# Patient Record
Sex: Female | Born: 1975 | Race: White | Hispanic: No | Smoking: Current every day smoker
Health system: Southern US, Community
[De-identification: ages and names within clinical notes are randomized; demographics above are authoritative.]

## PROBLEM LIST (undated history)

## (undated) DIAGNOSIS — F329 Major depressive disorder, single episode, unspecified: Secondary | ICD-10-CM

## (undated) DIAGNOSIS — J449 Chronic obstructive pulmonary disease, unspecified: Secondary | ICD-10-CM

## (undated) DIAGNOSIS — F419 Anxiety disorder, unspecified: Secondary | ICD-10-CM

## (undated) DIAGNOSIS — Z9884 Bariatric surgery status: Secondary | ICD-10-CM

## (undated) DIAGNOSIS — E079 Disorder of thyroid, unspecified: Secondary | ICD-10-CM

## (undated) DIAGNOSIS — G4700 Insomnia, unspecified: Secondary | ICD-10-CM

## (undated) DIAGNOSIS — E039 Hypothyroidism, unspecified: Secondary | ICD-10-CM

## (undated) DIAGNOSIS — IMO0001 Reserved for inherently not codable concepts without codable children: Secondary | ICD-10-CM

## (undated) DIAGNOSIS — F988 Other specified behavioral and emotional disorders with onset usually occurring in childhood and adolescence: Secondary | ICD-10-CM

## (undated) DIAGNOSIS — J45909 Unspecified asthma, uncomplicated: Secondary | ICD-10-CM

## (undated) DIAGNOSIS — G4730 Sleep apnea, unspecified: Secondary | ICD-10-CM

## (undated) DIAGNOSIS — F32A Depression, unspecified: Secondary | ICD-10-CM

## (undated) DIAGNOSIS — F319 Bipolar disorder, unspecified: Secondary | ICD-10-CM

## (undated) DIAGNOSIS — D500 Iron deficiency anemia secondary to blood loss (chronic): Secondary | ICD-10-CM

## (undated) DIAGNOSIS — R7303 Prediabetes: Secondary | ICD-10-CM

## (undated) DIAGNOSIS — K219 Gastro-esophageal reflux disease without esophagitis: Secondary | ICD-10-CM

## (undated) HISTORY — DX: Other specified behavioral and emotional disorders with onset usually occurring in childhood and adolescence: F98.8

## (undated) HISTORY — PX: ECTOPIC PREGNANCY SURGERY: SHX613

## (undated) HISTORY — DX: Anxiety disorder, unspecified: F41.9

## (undated) HISTORY — PX: GALLBLADDER SURGERY: SHX652

## (undated) HISTORY — DX: Disorder of thyroid, unspecified: E07.9

## (undated) HISTORY — DX: Bariatric surgery status: Z98.84

## (undated) HISTORY — DX: Gastro-esophageal reflux disease without esophagitis: K21.9

## (undated) HISTORY — PX: TONSILLECTOMY: SUR1361

## (undated) HISTORY — PX: CHOLECYSTECTOMY: SHX55

## (undated) HISTORY — DX: Reserved for inherently not codable concepts without codable children: IMO0001

## (undated) HISTORY — PX: DILATION AND CURETTAGE OF UTERUS: SHX78

## (undated) HISTORY — DX: Depression, unspecified: F32.A

## (undated) HISTORY — DX: Major depressive disorder, single episode, unspecified: F32.9

## (undated) HISTORY — DX: Insomnia, unspecified: G47.00

## (undated) HISTORY — DX: Iron deficiency anemia secondary to blood loss (chronic): D50.0

## (undated) HISTORY — PX: OTHER SURGICAL HISTORY: SHX169

---

## 2004-10-23 ENCOUNTER — Emergency Department: Payer: Self-pay | Admitting: Emergency Medicine

## 2005-11-17 ENCOUNTER — Emergency Department: Payer: Self-pay | Admitting: Emergency Medicine

## 2005-11-18 ENCOUNTER — Observation Stay: Payer: Self-pay | Admitting: Vascular Surgery

## 2006-02-25 ENCOUNTER — Other Ambulatory Visit: Payer: Self-pay

## 2006-02-25 ENCOUNTER — Inpatient Hospital Stay: Payer: Self-pay | Admitting: Internal Medicine

## 2006-02-26 ENCOUNTER — Other Ambulatory Visit: Payer: Self-pay

## 2006-03-21 ENCOUNTER — Ambulatory Visit: Payer: Self-pay | Admitting: Otolaryngology

## 2006-04-23 ENCOUNTER — Inpatient Hospital Stay: Payer: Self-pay | Admitting: Psychiatry

## 2006-08-06 ENCOUNTER — Encounter: Payer: Self-pay | Admitting: Maternal & Fetal Medicine

## 2006-08-13 ENCOUNTER — Encounter: Payer: Self-pay | Admitting: Maternal & Fetal Medicine

## 2006-09-24 ENCOUNTER — Encounter: Payer: Self-pay | Admitting: Maternal & Fetal Medicine

## 2007-02-11 ENCOUNTER — Ambulatory Visit: Payer: Self-pay | Admitting: Obstetrics & Gynecology

## 2007-02-12 ENCOUNTER — Inpatient Hospital Stay: Payer: Self-pay | Admitting: Obstetrics & Gynecology

## 2007-06-04 ENCOUNTER — Encounter: Payer: Self-pay | Admitting: Anesthesiology

## 2007-06-10 ENCOUNTER — Encounter: Payer: Self-pay | Admitting: Anesthesiology

## 2009-02-03 ENCOUNTER — Emergency Department: Payer: Self-pay | Admitting: Emergency Medicine

## 2009-02-21 ENCOUNTER — Inpatient Hospital Stay: Payer: Self-pay

## 2009-02-26 ENCOUNTER — Emergency Department: Payer: Self-pay | Admitting: Emergency Medicine

## 2009-09-16 ENCOUNTER — Emergency Department: Payer: Self-pay | Admitting: Emergency Medicine

## 2010-03-02 ENCOUNTER — Emergency Department: Payer: Self-pay | Admitting: Emergency Medicine

## 2010-03-17 ENCOUNTER — Encounter: Payer: Self-pay | Admitting: Maternal & Fetal Medicine

## 2010-03-29 ENCOUNTER — Emergency Department: Payer: Self-pay | Admitting: Emergency Medicine

## 2010-04-05 ENCOUNTER — Ambulatory Visit: Payer: Self-pay | Admitting: Family Medicine

## 2010-04-15 ENCOUNTER — Emergency Department: Payer: Self-pay | Admitting: Emergency Medicine

## 2010-04-19 ENCOUNTER — Ambulatory Visit: Payer: Self-pay | Admitting: Obstetrics & Gynecology

## 2010-04-21 ENCOUNTER — Emergency Department: Payer: Self-pay | Admitting: Emergency Medicine

## 2010-06-27 ENCOUNTER — Emergency Department: Payer: Self-pay | Admitting: Unknown Physician Specialty

## 2011-04-13 ENCOUNTER — Emergency Department: Payer: Self-pay | Admitting: *Deleted

## 2012-03-27 ENCOUNTER — Emergency Department: Payer: Self-pay | Admitting: Emergency Medicine

## 2012-06-11 ENCOUNTER — Ambulatory Visit: Payer: Self-pay | Admitting: Family Medicine

## 2012-08-22 ENCOUNTER — Emergency Department: Payer: Self-pay | Admitting: Emergency Medicine

## 2013-04-01 ENCOUNTER — Encounter: Payer: Self-pay | Admitting: Family Medicine

## 2013-04-09 ENCOUNTER — Encounter: Payer: Self-pay | Admitting: Family Medicine

## 2013-05-10 ENCOUNTER — Encounter: Payer: Self-pay | Admitting: Family Medicine

## 2013-05-13 ENCOUNTER — Ambulatory Visit: Payer: Self-pay | Admitting: Gastroenterology

## 2013-05-15 LAB — PATHOLOGY REPORT

## 2013-06-09 ENCOUNTER — Encounter: Payer: Self-pay | Admitting: Family Medicine

## 2013-07-10 ENCOUNTER — Encounter: Payer: Self-pay | Admitting: Family Medicine

## 2013-08-10 ENCOUNTER — Encounter: Payer: Self-pay | Admitting: Family Medicine

## 2013-09-07 ENCOUNTER — Encounter: Payer: Self-pay | Admitting: Family Medicine

## 2013-09-29 ENCOUNTER — Encounter: Payer: Self-pay | Admitting: Podiatry

## 2013-09-29 ENCOUNTER — Ambulatory Visit (INDEPENDENT_AMBULATORY_CARE_PROVIDER_SITE_OTHER): Payer: Medicaid Other

## 2013-09-29 ENCOUNTER — Ambulatory Visit (INDEPENDENT_AMBULATORY_CARE_PROVIDER_SITE_OTHER): Payer: Medicaid Other | Admitting: Podiatry

## 2013-09-29 VITALS — BP 140/90 | HR 86 | Resp 16 | Ht 62.0 in | Wt 315.0 lb

## 2013-09-29 DIAGNOSIS — M778 Other enthesopathies, not elsewhere classified: Secondary | ICD-10-CM

## 2013-09-29 DIAGNOSIS — M779 Enthesopathy, unspecified: Secondary | ICD-10-CM

## 2013-09-29 DIAGNOSIS — M79671 Pain in right foot: Secondary | ICD-10-CM

## 2013-09-29 DIAGNOSIS — M7752 Other enthesopathy of left foot: Secondary | ICD-10-CM

## 2013-09-29 DIAGNOSIS — M21619 Bunion of unspecified foot: Secondary | ICD-10-CM

## 2013-09-29 DIAGNOSIS — M65979 Unspecified synovitis and tenosynovitis, unspecified ankle and foot: Secondary | ICD-10-CM

## 2013-09-29 DIAGNOSIS — M659 Synovitis and tenosynovitis, unspecified: Secondary | ICD-10-CM

## 2013-09-29 NOTE — Progress Notes (Signed)
   Subjective:    Patient ID: Melanie Nicholson, female    DOB: 31-May-1976, 38 y.o.   MRN: [id number]  HPI Comments: Larey SeatFell back in feb of 2014 and since has had discomfort and pain with the left ankle. It stays swollen and getting worse with the pain. Had a mri done, that showed just tendonitis. i have used numerous braces, saw a orthapedic for it. Have stayed off of it, ice. Tried mobic. Marland Kitchen.   Foot Pain Associated symptoms include headaches.      Review of Systems  Musculoskeletal:       Joint pain  Back pain difficulty walking   Neurological: Positive for headaches.  All other systems reviewed and are negative.       Objective:   Physical Exam I have reviewed her past medical history medications allergies surgeries social history and review of systems. Vital signs are stable she is alert and oriented x3. Pulses are strongly palpable bilateral. Neurologic sensorium is intact per since once the monofilament bilateral. Deep tendon reflexes are brisk and equal bilateral. Neurologic sensorium is intact per since once the monofilament. Muscle strength is 5 over 5 dorsiflexors plantar flexors inverters everters all intrinsic musculature is intact. Orthopedic evaluation demonstrates all joints distal to the ankle a full range of motion without crepitus she has limitation on range of motion of the subtalar joint of the left foot either secondary to arthritic changes or secondary to splinting. Radiographic evaluation concerns me for a subtalar joint arthritic the posterior facet. She also has pain to the anterior talofibular ligament of the left ankle.        Assessment & Plan:  Assessment: Subtalar joint osteoarthritis posterior facet left.  Plan: Discussed etiology pathology conservative versus surgical therapies at this point I injected her subtalar joint of her left foot with Kenalog and local anesthetic after sterile Betadine skin prep due to her diabetes I did not provided her with a Medrol  Dosepak.

## 2013-09-30 ENCOUNTER — Telehealth: Payer: Self-pay | Admitting: *Deleted

## 2013-09-30 NOTE — Telephone Encounter (Signed)
Pt called said she was in yesterday and seen dr Al Corpushyatt and received injection in her ankle and now she feels bad and her ankle feels worse and it hurts when walking. Pt informed me she had a lot of braces for her foot but she could not wear a walking boot cause she can not walk in one. Told pt to ice,elevate, stay off of foot and to put on one of her braces on. Also told pt that it could take a couple of days before injection helps and it could make pain a little worse. Told pt to call back it symptoms worsen. Pt understood.

## 2013-10-27 ENCOUNTER — Ambulatory Visit: Payer: Medicaid Other | Admitting: Podiatry

## 2013-11-05 ENCOUNTER — Ambulatory Visit (INDEPENDENT_AMBULATORY_CARE_PROVIDER_SITE_OTHER): Payer: Medicaid Other | Admitting: Podiatry

## 2013-11-05 VITALS — Resp 16 | Ht 62.0 in | Wt 310.0 lb

## 2013-11-05 DIAGNOSIS — M779 Enthesopathy, unspecified: Principal | ICD-10-CM

## 2013-11-05 DIAGNOSIS — M778 Other enthesopathies, not elsewhere classified: Secondary | ICD-10-CM

## 2013-11-05 DIAGNOSIS — M7660 Achilles tendinitis, unspecified leg: Secondary | ICD-10-CM

## 2013-11-05 NOTE — Progress Notes (Signed)
She presents today for followup of subtalar joint capsulitis of the left foot and ankle.  Objective: She is much decrease in pain since last visit that she still has some tenderness on in range of motion of the subtalar joint left foot.  Assessment: Subtalar joint capsulitis left.  Plan: Injected left subtalar joint again with Kenalog and local anesthetic.

## 2013-11-26 ENCOUNTER — Ambulatory Visit (INDEPENDENT_AMBULATORY_CARE_PROVIDER_SITE_OTHER): Payer: Medicaid Other | Admitting: Podiatry

## 2013-11-26 VITALS — BP 133/83 | HR 95 | Resp 16

## 2013-11-26 DIAGNOSIS — M778 Other enthesopathies, not elsewhere classified: Secondary | ICD-10-CM

## 2013-11-26 DIAGNOSIS — M779 Enthesopathy, unspecified: Principal | ICD-10-CM

## 2013-11-26 DIAGNOSIS — G5792 Unspecified mononeuropathy of left lower limb: Secondary | ICD-10-CM

## 2013-11-26 DIAGNOSIS — G5760 Lesion of plantar nerve, unspecified lower limb: Secondary | ICD-10-CM

## 2013-11-26 DIAGNOSIS — B353 Tinea pedis: Secondary | ICD-10-CM

## 2013-11-26 MED ORDER — TERBINAFINE HCL 250 MG PO TABS: 250.0000 mg | ORAL_TABLET | Freq: Every day | ORAL | Status: DC

## 2013-11-26 NOTE — Progress Notes (Signed)
She presents today for followup of her subtalar joint capsulitis and sinus tarsi neuritis left foot. She states his second Kenalog shot did not do anything for her. She still feels better than she did previously.  Objective: Vital signs are stable she is alert and oriented x3. She has pain on palpation of the sinus tarsi left and pain on end range of motion of the subtalar joint left. No erythema edema cellulitis drainage or odor is noted.  Assessment: Subtalar joint capsulitis left foot.  Plan: Injected her first dose of dehydrated alcohol to the sinus tarsi of the left foot. We did discuss the possible need for orthotics and all followup with her in the near future.

## 2013-12-17 ENCOUNTER — Encounter: Payer: Self-pay | Admitting: Podiatry

## 2013-12-17 ENCOUNTER — Ambulatory Visit (INDEPENDENT_AMBULATORY_CARE_PROVIDER_SITE_OTHER): Payer: Medicaid Other | Admitting: Podiatry

## 2013-12-17 VITALS — BP 123/80 | HR 78 | Resp 16

## 2013-12-17 DIAGNOSIS — M778 Other enthesopathies, not elsewhere classified: Secondary | ICD-10-CM

## 2013-12-17 DIAGNOSIS — M2140 Flat foot [pes planus] (acquired), unspecified foot: Secondary | ICD-10-CM

## 2013-12-17 DIAGNOSIS — M779 Enthesopathy, unspecified: Principal | ICD-10-CM

## 2013-12-17 NOTE — Progress Notes (Signed)
She presents today states that the subtalar joint injection a provided her with last time did not work she states that I disappointed that the first injection worked of the second 2 injections did not work at all.  Objective: Vital signs are stable she is alert and oriented x3 pulses are palpable bilateral. She has no reproducible pain on palpation of the subtalar joint today.  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  Plan: I suggested an injection she declined. I then suggested orthotics and she was scanned. After scanning she states she did not wear the orthotics because of the price and she would let us know what she wants to do proceed.

## 2013-12-24 ENCOUNTER — Ambulatory Visit: Payer: Medicaid Other | Admitting: Podiatry

## 2014-10-12 ENCOUNTER — Ambulatory Visit (INDEPENDENT_AMBULATORY_CARE_PROVIDER_SITE_OTHER): Payer: Medicaid Other | Admitting: Podiatry

## 2014-10-12 ENCOUNTER — Encounter: Payer: Self-pay | Admitting: Podiatry

## 2014-10-12 ENCOUNTER — Ambulatory Visit (INDEPENDENT_AMBULATORY_CARE_PROVIDER_SITE_OTHER): Payer: Medicaid Other

## 2014-10-12 VITALS — BP 119/83 | HR 71 | Resp 16 | Ht 62.0 in | Wt 240.0 lb

## 2014-10-12 DIAGNOSIS — M7750 Other enthesopathy of unspecified foot: Secondary | ICD-10-CM

## 2014-10-12 DIAGNOSIS — M66872 Spontaneous rupture of other tendons, left ankle and foot: Secondary | ICD-10-CM

## 2014-10-12 DIAGNOSIS — M779 Enthesopathy, unspecified: Secondary | ICD-10-CM

## 2014-10-12 MED ORDER — METHYLPREDNISOLONE (PAK) 4 MG PO TABS: ORAL_TABLET | ORAL | Status: DC

## 2014-10-12 NOTE — Progress Notes (Signed)
   Subjective:    Patient ID: Melanie Nicholson, female    DOB: Oct 04, 1975, 39 y.o.   MRN: [id number]  HPI Comments: "I have pain in the foot and up my leg"  Patient c/o aching, sharp sensations medial and anterior ankle and shin left for about 3 years. She did fall and twist this foot in the snow initially and hasn't been right since. The area swells. She has had treatment here in the past. Was given injections then. She is trying to exercise due to gastric bypass, but making worse. Wears brace occasionally-no help.     Review of Systems  Constitutional: Positive for unexpected weight change.  Musculoskeletal: Positive for back pain, arthralgias and gait problem.  All other systems reviewed and are negative.      Objective:   Physical Exam: I have reviewed her past mental history medications allergies surgeon social history. Pulses are palpable bilateral. Neurologic sensorium is intact. Deep tendon reflexes are intact bilateral. Muscle strength is 5 over 5 dorsiflexion plantar flexors and inverters and everters with exception of the posterior tibial tendon of the left ankle. Orthopedic evaluation and stress all joints distal to the ankle range of motion without crepitation. However inversion against resistance is extremely painful for her and there is a considerable amount of fluid and edema peritendinous around the posterior tibial tendon. Radiographic evaluation does not demonstrate any type of osseus abnormalities in this area that appears to be rectus and abnormal structure.      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  Plan: At this point I encouraged her to get back into her [employer] [facility] HumphreysWalker and an MRI will be performed. I also placed her on a steroid for 6 days. Will follow up with her once her MRI has returned.

## 2014-10-14 ENCOUNTER — Encounter: Payer: Self-pay | Admitting: Podiatry

## 2014-10-14 NOTE — Progress Notes (Signed)
Medicaid denied mri for foot and ankle, patient needs to follow up with dr Al Corpushyatt, spoke to patient she is going back to her [facility]

## 2014-11-06 ENCOUNTER — Emergency Department
Admit: 2014-11-06 | Disposition: A | Discharge status: Home / Self Care | Payer: Self-pay | Admitting: Emergency Medicine

## 2014-11-06 LAB — BASIC METABOLIC PANEL
Anion Gap: 7 (ref 7–16)
BUN: 11 mg/dL
CALCIUM: 9 mg/dL
CO2: 23 mmol/L
CREATININE: 0.78 mg/dL
Chloride: 108 mmol/L
Glucose: 97 mg/dL
Potassium: 3.7 mmol/L
Sodium: 138 mmol/L

## 2014-11-06 LAB — CBC WITH DIFFERENTIAL/PLATELET
Basophil #: 0 10*3/uL (ref 0.0–0.1)
Basophil %: 0.7 %
Eosinophil #: 0.1 10*3/uL (ref 0.0–0.7)
Eosinophil %: 2.1 %
HCT: 40.6 % (ref 35.0–47.0)
HGB: 13.4 g/dL (ref 12.0–16.0)
Lymphocyte #: 2.4 10*3/uL (ref 1.0–3.6)
Lymphocyte %: 41.2 %
MCH: 30 pg (ref 26.0–34.0)
MCHC: 32.9 g/dL (ref 32.0–36.0)
MCV: 91 fL (ref 80–100)
MONO ABS: 0.4 x10 3/mm (ref 0.2–0.9)
Monocyte %: 7.4 %
Neutrophil #: 2.8 10*3/uL (ref 1.4–6.5)
Neutrophil %: 48.6 %
PLATELETS: 146 10*3/uL — AB (ref 150–440)
RBC: 4.45 10*6/uL (ref 3.80–5.20)
RDW: 13.5 % (ref 11.5–14.5)
WBC: 5.7 10*3/uL (ref 3.6–11.0)

## 2014-11-06 LAB — TROPONIN I

## 2015-04-03 ENCOUNTER — Emergency Department
Admission: EM | Admit: 2015-04-03 | Discharge: 2015-04-03 | Disposition: A | Discharge status: Home / Self Care | Payer: Medicaid Other | Attending: Emergency Medicine | Admitting: Emergency Medicine

## 2015-04-03 ENCOUNTER — Encounter: Payer: Self-pay | Admitting: Emergency Medicine

## 2015-04-03 DIAGNOSIS — Z87891 Personal history of nicotine dependence: Secondary | ICD-10-CM | POA: Diagnosis not present

## 2015-04-03 DIAGNOSIS — Z79899 Other long term (current) drug therapy: Secondary | ICD-10-CM | POA: Diagnosis not present

## 2015-04-03 DIAGNOSIS — L02213 Cutaneous abscess of chest wall: Secondary | ICD-10-CM | POA: Insufficient documentation

## 2015-04-03 DIAGNOSIS — R079 Chest pain, unspecified: Secondary | ICD-10-CM | POA: Diagnosis present

## 2015-04-03 DIAGNOSIS — J869 Pyothorax without fistula: Secondary | ICD-10-CM

## 2015-04-03 DIAGNOSIS — E119 Type 2 diabetes mellitus without complications: Secondary | ICD-10-CM | POA: Insufficient documentation

## 2015-04-03 LAB — GLUCOSE, CAPILLARY: Glucose-Capillary: 81 mg/dL (ref 65–99)

## 2015-04-03 MED ORDER — OXYCODONE-ACETAMINOPHEN 5-325 MG PO TABS: 1.0000 | ORAL_TABLET | ORAL | Status: DC | PRN

## 2015-04-03 MED ORDER — SULFAMETHOXAZOLE-TRIMETHOPRIM 800-160 MG PO TABS: 1.0000 | ORAL_TABLET | Freq: Two times a day (BID) | ORAL | Status: DC

## 2015-04-03 NOTE — ED Provider Notes (Signed)
[facility] Emergency Department Kenta Laster Note  ____________________________________________  Time seen: Approximately 6:40 PM  I have reviewed the triage vital signs and the nursing notes.   HISTORY  Chief Complaint Sore   HPI LEJLA MOESER is a 39 y.o. female with complaint of a nodule between her breast 2-3 days. Patient states it started as a small pimple looking area and has increased in size. It is tender to touch. Patient denies any nausea or vomiting, there is been no fever. Patient did take a half of Vicodin yesterday which did relieve some of the pain. She states that in the past she was told that she was diabetic however she is not taking metformin and her blood sugars have been normal. She discontinue the metformin sometime ago. Patient is approximately one half hours after meals after having half a chicken sandwich and drinking sweetened iced tea. Currently she rates her pain a 2 out of 10. She states that she is more worried then having pain at present.   Past Medical History  Diagnosis Date  . Gastric bypass status for obesity   . Diabetes mellitus without complication     There are no active problems to display for this patient.   History reviewed. No pertinent past surgical history.  Current Outpatient Rx  Name  Route  Sig  Dispense  Refill  . ALPRAZolam (XANAX PO)   Oral   Take by mouth.         . Levothyroxine Sodium (LEVOTHROID PO)   Oral   Take by mouth.         . methylPREDNIsolone (MEDROL DOSPACK) 4 MG tablet      follow package directions   21 tablet   0   . Multiple Vitamins-Minerals (MULTIVITAMIN ADULT PO)   Oral   Take by mouth. Bariatric vitamin         . OMEPRAZOLE PO   Oral   Take by mouth.         . oxyCODONE-acetaminophen (PERCOCET) 5-325 MG per tablet   Oral   Take 1 tablet by mouth every 4 (four) hours as needed for severe pain.   20 tablet   0   . PAROXETINE HCL PO   Oral   Take by  mouth.         . sulfamethoxazole-trimethoprim (BACTRIM DS,SEPTRA DS) 800-160 MG per tablet   Oral   Take 1 tablet by mouth 2 (two) times daily.   20 tablet   0   . Zolpidem Tartrate (AMBIEN PO)   Oral   Take by mouth.           Allergies Review of patient's allergies indicates no known allergies.  No family history on file.  Social History Social History  Substance Use Topics  . Smoking status: Former Games developer  . Smokeless tobacco: None  . Alcohol Use: No    Review of Systems Constitutional: No fever/chills ENT: No sore throat. Cardiovascular: Denies chest pain. Respiratory: Denies shortness of breath. Gastrointestinal:   No nausea, no vomiting.   Genitourinary: Negative for dysuria. Musculoskeletal: Negative for back pain. Skin: Negative for rash. Positive for tender lesion chest Neurological: Negative for headaches, focal weakness or numbness.  10-point ROS otherwise negative.  ____________________________________________   PHYSICAL EXAM:  VITAL SIGNS: ED Triage Vitals  Enc Vitals Group     BP 04/03/15 1727 125/84 mmHg     Pulse Rate 04/03/15 1727 87     Resp 04/03/15 1727 18  Temp 04/03/15 1727 98.3 F (36.8 C)     Temp Source 04/03/15 1727 Oral     SpO2 04/03/15 1727 96 %     Weight 04/03/15 1727 197 lb (89.359 kg)     Height 04/03/15 1727  (1.575 m)     Head Cir --      Peak Flow --      Pain Score 04/03/15 1739 2     Pain Loc --      Pain Edu? --      Excl. in GC? --     Constitutional: Alert and oriented. Well appearing and in no acute distress. Eyes: Conjunctivae are normal. PERRL. EOMI. Head: Atraumatic. Nose: No congestion/rhinnorhea. Neck: No stridor.   Cardiovascular: Normal rate, regular rhythm. Grossly normal heart sounds.  Good peripheral circulation. Respiratory: Normal respiratory effort.  No retractions. Lungs CTAB. Gastrointestinal: Soft and nontender. No distention Musculoskeletal: Moves upper extremities without  any difficulty. No lower extremity tenderness nor edema.  No joint effusions. Neurologic:  Normal speech and language. No gross focal neurologic deficits are appreciated. No gait instability. Skin:  Skin is warm, dry and intact. No rash noted. There is a tender 2 cm size firm nodule between the breast tissue approximately mid sternum. There is no fluctuance. Mild erythema is present. Psychiatric: Mood and affect are normal. Speech and behavior are normal.  ____________________________________________   LABS (all labs ordered are listed, but only abnormal results are displayed)  Labs Reviewed  GLUCOSE, CAPILLARY  CBG MONITORING, ED    PROCEDURES  Procedure(s) performed: None  Critical Care performed: No  ____________________________________________   INITIAL IMPRESSION / ASSESSMENT AND PLAN / ED COURSE  Pertinent labs & imaging results that were available during my care of the patient were reviewed by me and considered in my medical decision making (see chart for details).  Patient's blood sugar was 81 after having eaten. She was placed on Septra DS twice a day for 10 days and Percocet as needed for pain. She is to continue with warm compresses to the area. She is to follow-up with her PCP if any continued problems. She is return to the emergency room if any severe worsening of her symptoms are urgent concerns. ____________________________________________   FINAL CLINICAL IMPRESSION(S) / ED DIAGNOSES  Final diagnoses:  Abscess of chest      Tommi Rumps, PA-C 04/03/15 1942  Jene Every, MD 04/03/15 2351

## 2015-04-03 NOTE — ED Notes (Signed)
Nodule mid chest, noted 2 days ago small, increasing in size and firmness

## 2015-04-03 NOTE — ED Notes (Signed)
Patient reports she noticed a couple of nights ago a spot on her chest between her breasts.  It started off painful. She applied warm compress and noticed last night an increase in size.  Patient did take 1/2 vicodin yesterday which did help relieve some of the pain. C/o minimal pain today.

## 2015-04-03 NOTE — Discharge Instructions (Signed)
WARM MOIST COMPRESSES TO AREA SEPTRA DS FOR INFECTION AND PERCOCET FOR PAIN AS NEEDED FOLLOW UP WITH YOUR DOCTOR IF ANY CONTINUED PROBLEMS

## 2015-04-03 NOTE — ED Notes (Signed)
Patient with no complaints at this time. Respirations even and unlabored. Skin warm/dry. Discharge instructions reviewed with patient at this time. Patient given opportunity to voice concerns/ask questions. Patient discharged at this time and left Emergency Department with steady gait.   

## 2015-06-06 ENCOUNTER — Emergency Department
Admission: EM | Admit: 2015-06-06 | Discharge: 2015-06-06 | Disposition: A | Discharge status: Home / Self Care | Payer: Medicaid Other | Attending: Emergency Medicine | Admitting: Emergency Medicine

## 2015-06-06 ENCOUNTER — Encounter: Payer: Self-pay | Admitting: Emergency Medicine

## 2015-06-06 DIAGNOSIS — H53149 Visual discomfort, unspecified: Secondary | ICD-10-CM | POA: Insufficient documentation

## 2015-06-06 DIAGNOSIS — H538 Other visual disturbances: Secondary | ICD-10-CM | POA: Diagnosis not present

## 2015-06-06 DIAGNOSIS — R110 Nausea: Secondary | ICD-10-CM | POA: Insufficient documentation

## 2015-06-06 DIAGNOSIS — Z79899 Other long term (current) drug therapy: Secondary | ICD-10-CM | POA: Diagnosis not present

## 2015-06-06 DIAGNOSIS — E119 Type 2 diabetes mellitus without complications: Secondary | ICD-10-CM | POA: Insufficient documentation

## 2015-06-06 DIAGNOSIS — R51 Headache: Secondary | ICD-10-CM | POA: Diagnosis not present

## 2015-06-06 DIAGNOSIS — F419 Anxiety disorder, unspecified: Secondary | ICD-10-CM | POA: Diagnosis not present

## 2015-06-06 DIAGNOSIS — R519 Headache, unspecified: Secondary | ICD-10-CM

## 2015-06-06 DIAGNOSIS — Z87891 Personal history of nicotine dependence: Secondary | ICD-10-CM | POA: Insufficient documentation

## 2015-06-06 DIAGNOSIS — R109 Unspecified abdominal pain: Secondary | ICD-10-CM | POA: Insufficient documentation

## 2015-06-06 LAB — BASIC METABOLIC PANEL
Anion gap: 7 (ref 5–15)
BUN: 11 mg/dL (ref 6–20)
CALCIUM: 9.2 mg/dL (ref 8.9–10.3)
CHLORIDE: 106 mmol/L (ref 101–111)
CO2: 28 mmol/L (ref 22–32)
CREATININE: 0.82 mg/dL (ref 0.44–1.00)
GFR calc Af Amer: >60 mL/min (ref >60)
GFR calc non Af Amer: >60 mL/min (ref >60)
GLUCOSE: 92 mg/dL (ref 65–99)
Potassium: 3.4 mmol/L — ABNORMAL LOW (ref 3.5–5.1)
Sodium: 141 mmol/L (ref 135–145)

## 2015-06-06 LAB — CBC
HCT: 40.9 % (ref 35.0–47.0)
Hemoglobin: 14.1 g/dL (ref 12.0–16.0)
MCH: 31 pg (ref 26.0–34.0)
MCHC: 34.4 g/dL (ref 32.0–36.0)
MCV: 90 fL (ref 80.0–100.0)
PLATELETS: 179 10*3/uL (ref 150–440)
RBC: 4.55 MIL/uL (ref 3.80–5.20)
RDW: 13.1 % (ref 11.5–14.5)
WBC: 6.8 10*3/uL (ref 3.6–11.0)

## 2015-06-06 MED ORDER — METOCLOPRAMIDE HCL 5 MG/ML IJ SOLN
10.0000 mg | Freq: Once | INTRAMUSCULAR | Status: AC
  Administered 2015-06-06: 10 mg via INTRAVENOUS

## 2015-06-06 MED ORDER — DEXAMETHASONE SODIUM PHOSPHATE 10 MG/ML IJ SOLN
10.0000 mg | Freq: Once | INTRAMUSCULAR | Status: AC
  Administered 2015-06-06: 10 mg via INTRAVENOUS

## 2015-06-06 MED ORDER — DEXAMETHASONE SODIUM PHOSPHATE 10 MG/ML IJ SOLN
INTRAMUSCULAR | Status: AC
  Administered 2015-06-06: 10 mg via INTRAVENOUS
  Filled 2015-06-06: qty 1

## 2015-06-06 MED ORDER — DIPHENHYDRAMINE HCL 50 MG/ML IJ SOLN
INTRAMUSCULAR | Status: AC
  Administered 2015-06-06: 25 mg via INTRAVENOUS
  Filled 2015-06-06: qty 1

## 2015-06-06 MED ORDER — DIPHENHYDRAMINE HCL 50 MG/ML IJ SOLN
25.0000 mg | Freq: Once | INTRAMUSCULAR | Status: AC
  Administered 2015-06-06: 25 mg via INTRAVENOUS

## 2015-06-06 MED ORDER — METOCLOPRAMIDE HCL 5 MG/ML IJ SOLN
INTRAMUSCULAR | Status: AC
  Administered 2015-06-06: 10 mg via INTRAVENOUS
  Filled 2015-06-06: qty 2

## 2015-06-06 MED ORDER — SODIUM CHLORIDE 0.9 % IV BOLUS (SEPSIS)
500.0000 mL | INTRAVENOUS | Status: AC
  Administered 2015-06-06: 500 mL via INTRAVENOUS

## 2015-06-06 NOTE — ED Notes (Signed)
MD at bedside. 

## 2015-06-06 NOTE — ED Notes (Signed)
Pt states that vision becomes blurred when her blood pressure is high. She states she has been checking it through out the day. It was as high 162/17 and dropped at one point 78/40.

## 2015-06-06 NOTE — ED Notes (Signed)
Took pt a warm blanket.

## 2015-06-06 NOTE — Discharge Instructions (Signed)
You have been seen in the Emergency Department (ED) for a headache as well as concerns about your blood pressure.  As we discussed, your blood pressure was reassuring in the Emergency Department, and it is better to have your primary care doctor work with you on blood pressure control than for us to change your medication regimen.  Your lab work was all within normal limits today with no sign of damage caused by persistently elevated blood pressure.  Please use Tylenol or Motrin as needed for symptoms, but only as written on the box.   As we have discussed, please follow up with your primary care doctor as soon as possible regarding todays Emergency Department (ED) visit and your headache symptoms.    Call your doctor or return to the ED if you have a worsening headache, sudden and severe headache, confusion, slurred speech, facial droop, weakness or numbness in any arm or leg, extreme fatigue, vision problems, or other symptoms that concern you.   General Headache Without Cause A headache is pain or discomfort felt around the head or neck area. The specific cause of a headache may not be found. There are many causes and types of headaches. A few common ones are:  Tension headaches.  Migraine headaches.  Cluster headaches.  Chronic daily headaches. HOME CARE INSTRUCTIONS  Watch your condition for any changes. Take these steps to help with your condition: Managing Pain  Take over-the-counter and prescription medicines only as told by your health care provider.  Lie down in a dark, quiet room when you have a headache.  If directed, apply ice to the head and neck area:  Put ice in a plastic bag.  Place a towel between your skin and the bag.  Leave the ice on for 20 minutes, 2-3 times per day.  Use a heating pad or hot shower to apply heat to the head and neck area as told by your health care provider.  Keep lights dim if bright lights bother you or make your headaches worse. Eating  and Drinking  Eat meals on a regular schedule.  Limit alcohol use.  Decrease the amount of caffeine you drink, or stop drinking caffeine. General Instructions  Keep all follow-up visits as told by your health care provider. This is important.  Keep a headache journal to help find out what may trigger your headaches. For example, write down:  What you eat and drink.  How much sleep you get.  Any change to your diet or medicines.  Try massage or other relaxation techniques.  Limit stress.  Sit up straight, and do not tense your muscles.  Do not use tobacco products, including cigarettes, chewing tobacco, or e-cigarettes. If you need help quitting, ask your health care provider.  Exercise regularly as told by your health care provider.  Sleep on a regular schedule. Get 7-9 hours of sleep, or the amount recommended by your health care provider. SEEK MEDICAL CARE IF:   Your symptoms are not helped by medicine.  You have a headache that is different from the usual headache.  You have nausea or you vomit.  You have a fever. SEEK IMMEDIATE MEDICAL CARE IF:   Your headache becomes severe.  You have repeated vomiting.  You have a stiff neck.  You have a loss of vision.  You have problems with speech.  You have pain in the eye or ear.  You have muscular weakness or loss of muscle control.  You lose your balance or have  trouble walking.  You feel faint or pass out.  You have confusion.   This information is not intended to replace advice given to you by your health care provider. Make sure you discuss any questions you have with your health care provider.   Document Released: 06/26/2005 Document Revised: 03/17/2015 Document Reviewed: 10/19/2014 Elsevier Interactive Patient Education [employer]

## 2015-06-06 NOTE — ED Provider Notes (Signed)
[facility] Emergency Department Provider Note  ____________________________________________  Time seen: Approximately 7:02 PM  I have reviewed the triage vital signs and the nursing notes.   HISTORY  Chief Complaint Headache; Anxiety; and Blurred Vision    HPI Melanie Nicholson is a 39 y.o. female with a past medical history that includes ADHD currently on Vyvanse, gastric bypass, and an uncomplicated diabetes who presents with complaints of "bouncing" blood pressure but it is been higher more often than not.  She states that when her blood pressure is high her vision gets blurred and that she has a headache.  She does also complain with recurrent headache which she describes as gradual onset over the last several hours and currently severe with some photophobia.  She is very concerned about her blood pressure baseline and continually checks her blood pressure and her heart rate throughout the day and reports that her sugars been as high as 160 systolic and down as low as the 70s or 80s systolic.  He denies fever/chills, chest pain, shortness of breath, vomiting, dysuria.  Some mild nausea.  She states that she has some discomfort in her abdomen but is not specifically pain.Nothing makes her symptoms better and nothing makes it worse.  She states that she does not take blood pressure medicine regularly but that her primary care doctor did prescribe Bystolic which she should be taking.  It is unclear if she takes it regularly.   Past Medical History  Diagnosis Date  . Gastric bypass status for obesity   . Diabetes mellitus without complication (HCC)     There are no active problems to display for this patient.   History reviewed. No pertinent past surgical history.  Current Outpatient Rx  Name  Route  Sig  Dispense  Refill  . ALPRAZolam (XANAX PO)   Oral   Take by mouth.         . Levothyroxine Sodium (LEVOTHROID PO)   Oral   Take by mouth.         .  methylPREDNIsolone (MEDROL DOSPACK) 4 MG tablet      follow package directions   21 tablet   0   . Multiple Vitamins-Minerals (MULTIVITAMIN ADULT PO)   Oral   Take by mouth. Bariatric vitamin         . OMEPRAZOLE PO   Oral   Take by mouth.         . oxyCODONE-acetaminophen (PERCOCET) 5-325 MG per tablet   Oral   Take 1 tablet by mouth every 4 (four) hours as needed for severe pain.   20 tablet   0   . PAROXETINE HCL PO   Oral   Take by mouth.         . sulfamethoxazole-trimethoprim (BACTRIM DS,SEPTRA DS) 800-160 MG per tablet   Oral   Take 1 tablet by mouth 2 (two) times daily.   20 tablet   0   . Zolpidem Tartrate (AMBIEN PO)   Oral   Take by mouth.           Allergies Review of patient's allergies indicates no known allergies.  History reviewed. No pertinent family history.  Social History Social History  Substance Use Topics  . Smoking status: Former Games developer  . Smokeless tobacco: None  . Alcohol Use: No    Review of Systems Constitutional: No fever/chills Eyes: Blurry vision when her blood pressure is high.  Some photophobia ENT: No sore throat. Cardiovascular: Denies chest pain. Respiratory:  Denies shortness of breath. Gastrointestinal: Mild abdominal discomfort.  Mild nausea, no vomiting.  No diarrhea.  No constipation. Genitourinary: Negative for dysuria. Musculoskeletal: Negative for back pain. Skin: Negative for rash. Neurological: Severe headache, gradual in onset.  No focal numbness or weakness  10-point ROS otherwise negative.  ____________________________________________   PHYSICAL EXAM:  VITAL SIGNS: ED Triage Vitals  Enc Vitals Group     BP 06/06/15 1821 144/95 mmHg     Pulse Rate 06/06/15 1821 70     Resp 06/06/15 1821 17     Temp 06/06/15 1821 97.8 F (36.6 C)     Temp Source 06/06/15 1821 Oral     SpO2 06/06/15 1821 100 %     Weight 06/06/15 1821 190 lb (86.183 kg)     Height 06/06/15 1821 5\' 2"  (1.575 m)     Head  Cir --      Peak Flow --      Pain Score --      Pain Loc --      Pain Edu? --      Excl. in GC? --     Constitutional: Alert and oriented. Well appearing.  Appears quite anxious with her legs trembling.  Blocking her eyes from the light. Eyes: Conjunctivae are normal. PERRL. EOMI. normal funduscopic exam with no evidence of papilledema. Head: Atraumatic. Nose: No congestion/rhinnorhea. Mouth/Throat: Mucous membranes are moist.  Oropharynx non-erythematous. Neck: No stridor.  No meningismus. Cardiovascular: Normal rate, regular rhythm. Grossly normal heart sounds.  Good peripheral circulation. Respiratory: Normal respiratory effort.  No retractions. Lungs CTAB. Gastrointestinal: Soft and nontender. No distention. No abdominal bruits. No CVA tenderness. Musculoskeletal: No lower extremity tenderness nor edema.  No joint effusions. Neurologic:  Normal speech and language. No gross focal neurologic deficits are appreciated.  Skin:  Skin is warm, dry and intact. No rash noted. Psychiatric: Mood and affect are anxious. Speech is somewhat pressured and rapid.  ____________________________________________   LABS (all labs ordered are listed, but only abnormal results are displayed)  Labs Reviewed  BASIC METABOLIC PANEL - Abnormal; Notable for the following:    Potassium 3.4 (*)    All other components within normal limits  CBC   ____________________________________________  EKG  ED ECG REPORT I, Madi Bonfiglio, the attending physician, personally viewed and interpreted this ECG.  Date: 06/06/2015 EKG Time: 18:40 Rate: 57 Rhythm: borderline Sinus bradycardia QRS Axis: normal Intervals: normal ST/T Wave abnormalities: normal Conduction Disutrbances: none Narrative Interpretation: unremarkable  ____________________________________________  RADIOLOGY   No results found.  ____________________________________________   PROCEDURES  Procedure(s) performed:  None  Critical Care performed: No ____________________________________________   INITIAL IMPRESSION / ASSESSMENT AND PLAN / ED COURSE  Pertinent labs & imaging results that were available during my care of the patient were reviewed by me and considered in my medical decision making (see chart for details).  The patient is quite anxious about her blood pressure and obviously monitors carefully but it is reassuring here in the emergency department.  I suspect that her headache is most consistent with a migraine.  I will treat her for a migraine headache to provide some IV fluids.  I am avoiding NSAIDs because of her history of gastric bypass.  I suspect that she will feel better after this and will be appropriate for outpatient management.  I explained to the patient that I will not intervene on her blood pressure and last there is evidence of end organ dysfunction based on her basic metabolic panel, which  there is not.  ----------------------------------------- 8:59 PM on 06/06/2015 -----------------------------------------  Patient states she feels better and is lying in bed comfortably, talking on the phone.  Will d/c for outpatient follow up.  No indication of serious/emergent medical condition at this time. ____________________________________________  FINAL CLINICAL IMPRESSION(S) / ED DIAGNOSES  Final diagnoses:  Generalized headache      NEW MEDICATIONS STARTED DURING THIS VISIT:  New Prescriptions   No medications on file     Loleta Rose, MD 06/06/15 2103

## 2015-06-06 NOTE — ED Notes (Signed)
Pt presents to ER with chief complaint of headache, little blurred vision, abdominal pain, chest pain that started the last couple of weeks with a headache around 3-4 pm. Hx HTN. Pt alert and oriented

## 2016-03-02 ENCOUNTER — Encounter: Payer: Self-pay | Admitting: Obstetrics and Gynecology

## 2016-03-02 ENCOUNTER — Ambulatory Visit (INDEPENDENT_AMBULATORY_CARE_PROVIDER_SITE_OTHER): Payer: Medicaid Other | Admitting: Obstetrics and Gynecology

## 2016-03-02 VITALS — BP 126/82 | HR 64 | Ht 62.0 in | Wt 177.7 lb

## 2016-03-02 DIAGNOSIS — Z9049 Acquired absence of other specified parts of digestive tract: Secondary | ICD-10-CM | POA: Insufficient documentation

## 2016-03-02 DIAGNOSIS — Z9884 Bariatric surgery status: Secondary | ICD-10-CM

## 2016-03-02 DIAGNOSIS — Z308 Encounter for other contraceptive management: Secondary | ICD-10-CM

## 2016-03-02 DIAGNOSIS — Z98891 History of uterine scar from previous surgery: Secondary | ICD-10-CM | POA: Diagnosis not present

## 2016-03-02 DIAGNOSIS — Z8759 Personal history of other complications of pregnancy, childbirth and the puerperium: Secondary | ICD-10-CM | POA: Diagnosis not present

## 2016-03-02 DIAGNOSIS — Z9889 Other specified postprocedural states: Secondary | ICD-10-CM

## 2016-03-02 NOTE — Progress Notes (Signed)
GYN ENCOUNTER NOTE  Subjective:       Melanie Nicholson is a 40 y.o. [phone] female is here for gynecologic evaluation of the following issues:  1. Contraception management  Patient presents for consideration of bilateral tubal ligation. She has a complex past gynecologic and surgical history including:  C-section 4 (3 midline incisions, one Pfannenstiel incision)  Ectopic pregnancy-midline incision  Laparoscopic cholecystectomy  Lap banding.    Patient is in a monogamous relationship for the past 11 years. Partner is 694 years old. Vasectomy is recommended but patient is interested in other female contraceptive options  Patient has had Mirena IUD in past but discontinued it due to pelvic pain and partner discomfort with intercourse.   Gynecologic History Patient's last menstrual period was 02/12/2016.  Obstetric History OB History  Gravida Para Term Preterm AB Living  7 4 4   3 4   SAB TAB Ectopic Multiple Live Births  2   1   4     # Outcome Date GA Lbr Len/2nd Weight Sex Delivery Anes PTL Lv  7 SAB 2011          6 Ectopic 2010          5 Term 2008   8 lb 14.4 oz (4.037 kg) F CS-LTranv   LIV  4 Term 2002   8 lb 1.6 oz (3.674 kg) F CS-LTranv   LIV  3 Term 2000   8 lb 8 oz (3.856 kg) M CS-LTranv   LIV  2 Term 1998   9 lb 14.4 oz (4.491 kg) F CS-LTranv   LIV  1 SAB 1995              Past Medical History:  Diagnosis Date  . ADD (attention deficit disorder)   . Anxiety   . Bipolar 1 disorder (HCC)   . Depression   . Diabetes mellitus without complication (HCC)   . Gastric bypass status for obesity   . Insomnia   . Thyroid disease     Past Surgical History:  Procedure Laterality Date  . CESAREAN SECTION     x4  . CHOLECYSTECTOMY    . DILATION AND CURETTAGE OF UTERUS    . ECTOPIC PREGNANCY SURGERY    . gastic bypass     05/2014  . TONSILLECTOMY      Current Outpatient Prescriptions on File Prior to Visit  Medication Sig Dispense Refill  . ALPRAZolam (XANAX PO)  Take by mouth.    . Levothyroxine Sodium (LEVOTHROID PO) Take by mouth.    . Multiple Vitamins-Minerals (MULTIVITAMIN ADULT PO) Take by mouth. Bariatric vitamin    . Zolpidem Tartrate (AMBIEN PO) Take by mouth.     No current facility-administered medications on file prior to visit.     No Known Allergies  Social History   Social History  . Marital status: Legally Separated    Spouse name: N/A  . Number of children: N/A  . Years of education: N/A   Occupational History  . Not on file.   Social History Main Topics  . Smoking status: Former Smoker    Quit date: 12/2014  . Smokeless tobacco: Not on file  . Alcohol use 0.0 oz/week     Comment: occas  . Drug use: No  . Sexual activity: Yes    Birth control/ protection: None   Other Topics Concern  . Not on file   Social History Narrative  . No narrative on file    Family History  Problem Relation Age of Onset  . Heart disease Mother   . Heart disease Father   . Diabetes Paternal Grandmother   . Breast cancer Neg Hx   . Ovarian cancer Neg Hx   . Colon cancer Neg Hx     The following portions of the patient's history were reviewed and updated as appropriate: allergies, current medications, past family history, past medical history, past social history, past surgical history and problem list.  Review of Systems Review of Systems - General ROS: negative for - chills, fatigue, fever, hot flashes, malaise or night sweats Hematological and Lymphatic ROS: negative for - bleeding problems or swollen lymph nodes Gastrointestinal ROS: negative for - abdominal pain, blood in stools, change in bowel habits and nausea/vomiting Musculoskeletal ROS: negative for - joint pain, muscle pain or muscular weakness Genito-Urinary ROS: negative for - change in menstrual cycle, dysmenorrhea, dyspareunia, dysuria, genital discharge, genital ulcers, hematuria, incontinence, irregular/heavy menses, nocturia or pelvic painjj  Objective:   BP  126/82   Pulse 64   Ht 5\' 2"  (1.575 m)   Wt 177 lb 11.2 oz (80.6 kg)   LMP 02/12/2016   BMI 32.50 kg/m  CONSTITUTIONAL: Well-developed, well-nourished female in no acute distress.  Physical exam-deferred   Assessment:   1.Contraceptive management-patient is interested in definitive female surgical procedure  2. History of multiple abdominal surgical procedures including 3 C-sections through a midline incision, 1 C-section through a Pfannenstiel incision, on laparotomy for ectopic pregnancy through a midline incision, laparoscopic gallbladder surgery, and a lap banding procedure. Further surgical intervention into the abdominal cavity for an elective procedure poses significant surgical risk. Other less morbid alternatives should be considered  3. Patient declines IUD because of previous spent experience with pain and cramping  4. Vasectomy is not an option  5. Nexplanon is not desired  6.Essure is recommended-patient is referred to Dr. Valentino Saxonherry for this procedure  A total of 30 minutes were spent face-to-face with the patient during the encounter with greater than 50% dealing with counseling and coordination of care.  Herold HarmsMartin A Cloie Wooden, MD  Note: This dictation was prepared with Dragon dictation along with smaller phrase technology. Any transcriptional errors that result from this process are unintentional.

## 2016-03-02 NOTE — Patient Instructions (Signed)
1.Essure sterilization is scheduled with Dr. Valentino Saxonherry 2. Return for preop appointment

## 2016-03-24 ENCOUNTER — Encounter: Payer: Self-pay | Admitting: Emergency Medicine

## 2016-03-24 ENCOUNTER — Emergency Department
Admission: EM | Admit: 2016-03-24 | Discharge: 2016-03-24 | Disposition: A | Discharge status: Home / Self Care | Payer: Medicaid Other | Attending: Emergency Medicine | Admitting: Emergency Medicine

## 2016-03-24 ENCOUNTER — Emergency Department: Payer: Medicaid Other

## 2016-03-24 DIAGNOSIS — O200 Threatened abortion: Secondary | ICD-10-CM | POA: Insufficient documentation

## 2016-03-24 DIAGNOSIS — R8290 Unspecified abnormal findings in urine: Secondary | ICD-10-CM | POA: Insufficient documentation

## 2016-03-24 DIAGNOSIS — E119 Type 2 diabetes mellitus without complications: Secondary | ICD-10-CM | POA: Insufficient documentation

## 2016-03-24 DIAGNOSIS — Z87891 Personal history of nicotine dependence: Secondary | ICD-10-CM | POA: Insufficient documentation

## 2016-03-24 DIAGNOSIS — Z3A01 Less than 8 weeks gestation of pregnancy: Secondary | ICD-10-CM | POA: Insufficient documentation

## 2016-03-24 DIAGNOSIS — O26891 Other specified pregnancy related conditions, first trimester: Secondary | ICD-10-CM | POA: Diagnosis present

## 2016-03-24 DIAGNOSIS — F909 Attention-deficit hyperactivity disorder, unspecified type: Secondary | ICD-10-CM | POA: Diagnosis not present

## 2016-03-24 DIAGNOSIS — Z79899 Other long term (current) drug therapy: Secondary | ICD-10-CM | POA: Diagnosis not present

## 2016-03-24 LAB — URINALYSIS COMPLETE WITH MICROSCOPIC (ARMC ONLY)
BILIRUBIN URINE: NEGATIVE
Bacteria, UA: NONE SEEN
GLUCOSE, UA: NEGATIVE mg/dL
Hgb urine dipstick: NEGATIVE
Ketones, ur: NEGATIVE mg/dL
Nitrite: NEGATIVE
PH: 5 (ref 5.0–8.0)
Protein, ur: NEGATIVE mg/dL
Specific Gravity, Urine: 1.019 (ref 1.005–1.030)

## 2016-03-24 LAB — COMPREHENSIVE METABOLIC PANEL
ALK PHOS: 28 U/L — AB (ref 38–126)
ALT: 23 U/L (ref 14–54)
ANION GAP: 6 (ref 5–15)
AST: 23 U/L (ref 15–41)
Albumin: 3.8 g/dL (ref 3.5–5.0)
BILIRUBIN TOTAL: 0.8 mg/dL (ref 0.3–1.2)
BUN: 8 mg/dL (ref 6–20)
CALCIUM: 8.7 mg/dL — AB (ref 8.9–10.3)
CO2: 24 mmol/L (ref 22–32)
Chloride: 106 mmol/L (ref 101–111)
Creatinine, Ser: 0.72 mg/dL (ref 0.44–1.00)
GFR calc Af Amer: >60 mL/min (ref >60)
GFR calc non Af Amer: >60 mL/min (ref >60)
GLUCOSE: 129 mg/dL — AB (ref 65–99)
Potassium: 3.8 mmol/L (ref 3.5–5.1)
SODIUM: 136 mmol/L (ref 135–145)
TOTAL PROTEIN: 6.2 g/dL — AB (ref 6.5–8.1)

## 2016-03-24 LAB — CBC WITH DIFFERENTIAL/PLATELET
Basophils Absolute: 0 10*3/uL (ref 0–0.1)
Basophils Relative: 1 %
EOS ABS: 0.1 10*3/uL (ref 0–0.7)
EOS PCT: 2 %
HCT: 37 % (ref 35.0–47.0)
Hemoglobin: 12.9 g/dL (ref 12.0–16.0)
LYMPHS ABS: 2.4 10*3/uL (ref 1.0–3.6)
Lymphocytes Relative: 42 %
MCH: 31.1 pg (ref 26.0–34.0)
MCHC: 34.9 g/dL (ref 32.0–36.0)
MCV: 89.2 fL (ref 80.0–100.0)
MONO ABS: 0.4 10*3/uL (ref 0.2–0.9)
Monocytes Relative: 6 %
Neutro Abs: 2.8 10*3/uL (ref 1.4–6.5)
Neutrophils Relative %: 49 %
PLATELETS: 160 10*3/uL (ref 150–440)
RBC: 4.15 MIL/uL (ref 3.80–5.20)
RDW: 13.1 % (ref 11.5–14.5)
WBC: 5.7 10*3/uL (ref 3.6–11.0)

## 2016-03-24 LAB — WET PREP, GENITAL
CLUE CELLS WET PREP: NONE SEEN
SPERM: NONE SEEN
Trich, Wet Prep: NONE SEEN
YEAST WET PREP: NONE SEEN

## 2016-03-24 LAB — POCT PREGNANCY, URINE: Preg Test, Ur: POSITIVE — AB

## 2016-03-24 LAB — ABO/RH: ABO/RH(D): A POS

## 2016-03-24 LAB — CHLAMYDIA/NGC RT PCR (ARMC ONLY)
Chlamydia Tr: NOT DETECTED
N GONORRHOEAE: NOT DETECTED

## 2016-03-24 LAB — HCG, QUANTITATIVE, PREGNANCY: hCG, Beta Chain, Quant, S: 21470 m[IU]/mL — ABNORMAL HIGH (ref <5)

## 2016-03-24 NOTE — ED Notes (Signed)
AAOx3.  Skin warm and dry. NAD.  Ambulates with easy and steady gait.   

## 2016-03-24 NOTE — ED Provider Notes (Addendum)
[facility] Emergency Department Provider Note  ____________________________________________   I have reviewed the triage vital signs and the nursing notes.   HISTORY  Chief Complaint Abdominal Pain    HPI Melanie Nicholson is a 40 y.o. female Who is G8 P4 who had 2 miscarriages in the past and one ectopic pregnancy which required surgery and possibly an oophorectomyand removal of tube but she is not sure if that was what happened and she is not sure what side that would've been on. She thinks it was for 5 years ago. Patient states that she has also had gastric bypass surgery. She has been having unprotected sex with her partner for some time, he has been her desire however to be established on definitive birth control.she went to get a subcutaneous birth control device and as a result of that visit a pregnancy test was obtained which showed that she was pregnant. Patient states that she has had occasion and because of her history of ectopic she wanted to be brought in. She does not have any active bleeding at this time she is to not having any significant pain at this time. Patient states that she is unsure when her period was, her last normal menstrual period was early in June which would give her a date approximately 13 weeks however she also had some bleeding in early August which would give her a dates and the 5-7 range and she is not sure which one actually represents her last period as the August bleeding was less substantial than normal. She does however have a history of irregular periods so essentially there is no way to know how far along she is.      Past Medical History:  Diagnosis Date  . ADD (attention deficit disorder)   . Anxiety   . Bipolar 1 disorder (HCC)   . Depression   . Diabetes mellitus without complication (HCC)   . Gastric bypass status for obesity   . Insomnia   . Thyroid disease     Patient Active Problem List   Diagnosis Date Noted   . History of cesarean section 03/02/2016  . History of ectopic pregnancy 03/02/2016  . History of cholecystectomy 03/02/2016  . History of gastric bypass 03/02/2016    Past Surgical History:  Procedure Laterality Date  . CESAREAN SECTION     x4  . CHOLECYSTECTOMY    . DILATION AND CURETTAGE OF UTERUS    . ECTOPIC PREGNANCY SURGERY    . gastic bypass     05/2014  . TONSILLECTOMY      Prior to Admission medications   Medication Sig Start Date End Date Taking? Authorizing Provider  ALPRAZolam (XANAX PO) Take by mouth.    Historical Provider, MD  cholecalciferol (VITAMIN D) 1000 units tablet Take 1,000 Units by mouth daily.    Historical Provider, MD  Levothyroxine Sodium (LEVOTHROID PO) Take by mouth.    Historical Provider, MD  lisdexamfetamine (VYVANSE) 70 MG capsule Take 70 mg by mouth daily.    Historical Provider, MD  Multiple Vitamins-Minerals (MULTIVITAMIN ADULT PO) Take by mouth. Bariatric vitamin    Historical Provider, MD  QUEtiapine (SEROQUEL) 100 MG tablet Take 150 mg by mouth at bedtime.    Historical Provider, MD  Zolpidem Tartrate (AMBIEN PO) Take by mouth.    Historical Provider, MD    Allergies Review of patient's allergies indicates no known allergies.  Family History  Problem Relation Age of Onset  . Heart disease Mother   .  Heart disease Father   . Diabetes Paternal Grandmother   . Breast cancer Neg Hx   . Ovarian cancer Neg Hx   . Colon cancer Neg Hx     Social History Social History  Substance Use Topics  . Smoking status: Former Smoker    Quit date: 12/2014  . Smokeless tobacco: Never Used  . Alcohol use 0.0 oz/week     Comment: occas    Review of Systems Constitutional: No fever/chills Eyes: No visual changes. ENT: No sore throat. No stiff neck no neck pain Cardiovascular: Denies chest pain. Respiratory: Denies shortness of breath. Gastrointestinal:   no vomiting.  No diarrhea.  No constipation. Genitourinary: Negative for  dysuria. Musculoskeletal: Negative lower extremity swelling Skin: Negative for rash. Neurological: Negative for severe headaches, focal weakness or numbness. 10-point ROS otherwise negative.  ____________________________________________   PHYSICAL EXAM:  VITAL SIGNS: ED Triage Vitals  Enc Vitals Group     BP 03/24/16 0941 119/82     Pulse Rate 03/24/16 0941 76     Resp 03/24/16 0941 18     Temp 03/24/16 0941 98.6 F (37 C)     Temp Source 03/24/16 0941 Oral     SpO2 03/24/16 0941 99 %     Weight 03/24/16 0941 180 lb (81.6 kg)     Height 03/24/16 0941 5\' 2"  (1.575 m)     Head Circumference --      Peak Flow --      Pain Score 03/24/16 0935 5     Pain Loc --      Pain Edu? --      Excl. in GC? --     Constitutional: Alert and oriented. Well appearing and in no acute distress. Eyes: Conjunctivae are normal. PERRL. EOMI. Head: Atraumatic. Nose: No congestion/rhinnorhea. Mouth/Throat: Mucous membranes are moist.  Oropharynx non-erythematous. Neck: No stridor.   Nontender with no meningismus Cardiovascular: Normal rate, regular rhythm. Grossly normal heart sounds.  Good peripheral circulation. Respiratory: Normal respiratory effort.  No retractions. Lungs CTAB. Abdominal: Soft and nontender. No distention. No guarding no rebound Back:  There is no focal tenderness or step off.  there is no midline tenderness there are no lesions noted. there is no CVA tenderness Musculoskeletal: No lower extremity tenderness, no upper extremity tenderness. No joint effusions, no DVT signs strong distal pulses no edema Pelvic exam: Female nurse chaperone present, no external lesions noted, physiologic vaginal discharge noted with no purulent discharge, no cervical motion tenderness, no adnexal  mass, there is minimal ptosis palpation on the left adnexa. is no significant uterine tenderness or uterine height is limited by scar tissue from prior surgery and obesity, does not however appear to be  consistent with 13 weeks. Appears to be still in the pelvis. No vaginal bleeding Neurologic:  Normal speech and language. No gross focal neurologic deficits are appreciated.  Skin:  Skin is warm, dry and intact. No rash noted. Psychiatric: Mood and affect are normal. Speech and behavior are normal.  ____________________________________________   LABS (all labs ordered are listed, but only abnormal results are displayed)  Labs Reviewed  CHLAMYDIA/NGC RT PCR ([facility] ONLY)  WET PREP, GENITAL  URINALYSIS COMPLETEWITH MICROSCOPIC ([facility] ONLY)  HCG, QUANTITATIVE, PREGNANCY  CBC WITH DIFFERENTIAL/PLATELET  COMPREHENSIVE METABOLIC PANEL  POC URINE PREG, ED  ABO/RH   ____________________________________________  EKG  I personally interpreted any EKGs ordered by me or triage  ____________________________________________  RADIOLOGY  I reviewed any imaging ordered by me or triage that were  performed during my shift and, if possible, patient and/or family made aware of any abnormal findings. ____________________________________________   PROCEDURES  Procedure(s) performed: None  Procedures  Critical Care performed: None  ____________________________________________   INITIAL IMPRESSION / ASSESSMENT AND PLAN / ED COURSE  Pertinent labs & imaging results that were available during my care of the patient were reviewed by me and considered in my medical decision making (see chart for details).  The patient presents today with spotting, cramping and history of ectopic pregnancy with a positive pregnancy test. Does have some mild left adnexal pain but no evidence of hemoperitoneum. Given history of ectopic pregnancy we will of course obtain ultrasound for further evaluation.  ----------------------------------------- 1:10 PM on 03/24/2016 -----------------------------------------  Serial exam exams are quite reassuring, patient will follow up as an outpatient. Likely IUP,  no evidence of EUP, Rh+ return precautions and follow-up given and understood   Clinical Course   ____________________________________________   FINAL CLINICAL IMPRESSION(S) / ED DIAGNOSES  Final diagnoses:  None      This chart was dictated using voice recognition software.  Despite best efforts to proofread,  errors can occur which can change meaning.      Jeanmarie Plant, MD 03/24/16 1034    Jeanmarie Plant, MD 03/24/16 [phone]

## 2016-03-24 NOTE — ED Notes (Signed)
Patient transported to Ultrasound 

## 2016-03-24 NOTE — ED Triage Notes (Signed)
Recently had positive preg test.  Today having LLQ pain.  States she has had an ectopic in the past but unsure which tube they removed.  Also having spotting.

## 2016-03-25 LAB — URINE CULTURE: Culture: NO GROWTH

## 2016-03-28 ENCOUNTER — Encounter: Payer: Self-pay | Admitting: Obstetrics and Gynecology

## 2016-03-28 ENCOUNTER — Other Ambulatory Visit: Payer: Self-pay | Admitting: Obstetrics and Gynecology

## 2016-03-28 ENCOUNTER — Ambulatory Visit (INDEPENDENT_AMBULATORY_CARE_PROVIDER_SITE_OTHER): Payer: Medicaid Other | Admitting: Obstetrics and Gynecology

## 2016-03-28 VITALS — BP 99/63 | HR 80 | Ht 62.0 in | Wt 181.2 lb

## 2016-03-28 DIAGNOSIS — Z9889 Other specified postprocedural states: Secondary | ICD-10-CM

## 2016-03-28 DIAGNOSIS — Z3491 Encounter for supervision of normal pregnancy, unspecified, first trimester: Secondary | ICD-10-CM

## 2016-03-28 DIAGNOSIS — O09291 Supervision of pregnancy with other poor reproductive or obstetric history, first trimester: Secondary | ICD-10-CM | POA: Diagnosis not present

## 2016-03-28 DIAGNOSIS — Z8759 Personal history of other complications of pregnancy, childbirth and the puerperium: Secondary | ICD-10-CM | POA: Diagnosis not present

## 2016-03-28 DIAGNOSIS — O09521 Supervision of elderly multigravida, first trimester: Secondary | ICD-10-CM

## 2016-03-28 DIAGNOSIS — Z3490 Encounter for supervision of normal pregnancy, unspecified, unspecified trimester: Secondary | ICD-10-CM

## 2016-03-28 NOTE — Progress Notes (Signed)
GYNECOLOGY PROGRESS NOTE  Subjective:    Patient ID: Melanie Nicholson, female    DOB: Jan 15, 1976, 40 y.o.   MRN: [id number]  HPI  Patient is a 40 y.o. [id number] female who presents for follow up from Emergency Room on 03/24/16 for complaints of abdominal pain in early pregnancy.  Patient had initially presented to [facility] for Nexplanon insertion last week, but was noted to have a positive pregnancy test (had also previously desired an Essure in August).   Patient reports that due to her history of prior miscarriages and ectopic pregnancy that she wanted to be checked out.  Denied any vaginal bleeding at time of presentation.  Patient had h/o irregular menses over the past 2-3 months (had a regular cycle 12/22/15, then no cycle in July, then 02/12/16 had a 2 day cycle).  Of note, patient also with multiple partners during this time frame, so is also unsure of paternity and conception date.  Ultrasound performed in the ER notes gestational age closer to 6 weeks, which makes August cycle LMP. Currently denies any pain.  Is unsure of desires to keep or terminate pregnancy, states that it depended upon the possible paternity.    Past Surgical History:  Procedure Laterality Date  . CESAREAN SECTION     x4  . CHOLECYSTECTOMY    . DILATION AND CURETTAGE OF UTERUS    . ECTOPIC PREGNANCY SURGERY    . gastic bypass     05/2014  . TONSILLECTOMY      The following portions of the patient's history were reviewed and updated as appropriate: allergies, current medications, past family history, past medical history, past social history, past surgical history and problem list.  Review of Systems A comprehensive review of systems was negative.   Objective:   Blood pressure 99/63, pulse 80, height 5\' 2"  (1.575 m), weight 181 lb 3.2 oz (82.2 kg), last menstrual period 02/12/2016. Exam deferred.    Imaging:  CLINICAL DATA:  Cramping pain, history of ectopic pregnancy  EXAM: OBSTETRIC <14 WK US AND  TRANSVAGINAL OB US  TECHNIQUE: Both transabdominal and transvaginal ultrasound examinations were performed for complete evaluation of the gestation as well as the maternal uterus, adnexal regions, and pelvic cul-de-sac. Transvaginal technique was performed to assess early pregnancy.  COMPARISON:  None.  FINDINGS: Intrauterine gestational sac: Single  Yolk sac:  Present  Embryo:  Not visualized  Cardiac Activity: Not visualized  MSD: 12.7  mm   6 w   1  d  Subchorionic hemorrhage:  None visualized.  Maternal uterus/adnexae: Normal bilateral ovaries. No adnexal mass. No pelvic free fluid.  IMPRESSION: 1. Probable early intrauterine gestational sac and yolk sac, but no fetal pole or cardiac activity yet visualized. Recommend follow-up quantitative B-HCG levels and follow-up US in 14 days to confirm and assess viability. This recommendation follows SRU consensus guidelines: Diagnostic Criteria for Nonviable Pregnancy Early in the First Trimester. Malva Limes Engl J Med 2013; 147:8295-62; 369:1443-51.  Labs:  Results for Melanie LanceNDRADE, Aubrei M (MRN [id number]) as of 03/29/2016 00:29  Ref. Range 03/24/2016 10:02  HCG, Beta Chain, Quant, S Latest Ref Range: <5 mIU/mL 21,470 (H)   Assessment:   Early IUP vs missed ab H/o miscarriages H/o ectopic pregnancy H/o multiple abdominal surgeries (including 4 C-sections) Advanced maternal age in pregnancy  Plan:   - Discussed possible diagnoses of early IUP vs missed Ab.  Will order repeat BHCG to assess trend.  If hormone levels rising appropriately, can repeat ultrasound next week to  confirm viability.  Will notify patient of results by phone.  - Discussed results of ultrasound with patient regarding dating.  Based on gestational age, August cycle is what she is dated from.  Based on this, patient notes that she would like to keep the pregnancy as she can now determine the paternity.   - Desires referral to [facility] for prenatal care if pregnancy is normal due to  high risk due to h/o multiple abdominal surgeries. Will refer if pregnancy viability is confirmed.    A total of 15 minutes were spent face-to-face with the patient during this encounter and over half of that time dealt with counseling and coordination of care.   Hildred Laser, MD [facility]

## 2016-03-29 ENCOUNTER — Telehealth: Payer: Self-pay | Admitting: Obstetrics and Gynecology

## 2016-03-29 LAB — BETA HCG QUANT (REF LAB): HCG QUANT: 33655 m[IU]/mL

## 2016-03-29 NOTE — Telephone Encounter (Signed)
This pt had HCG levels drawn yesterday and is calling for results

## 2016-03-29 NOTE — Telephone Encounter (Signed)
Could you please call this pt and let her know that we do not have the results yet. We will be in contact when we do. Thanks

## 2016-03-30 ENCOUNTER — Encounter: Payer: Self-pay | Admitting: Obstetrics and Gynecology

## 2016-03-30 ENCOUNTER — Other Ambulatory Visit: Payer: Self-pay | Admitting: Obstetrics and Gynecology

## 2016-03-30 ENCOUNTER — Other Ambulatory Visit: Payer: Medicaid Other

## 2016-03-30 DIAGNOSIS — Z3490 Encounter for supervision of normal pregnancy, unspecified, unspecified trimester: Secondary | ICD-10-CM | POA: Insufficient documentation

## 2016-03-30 DIAGNOSIS — O09529 Supervision of elderly multigravida, unspecified trimester: Secondary | ICD-10-CM | POA: Insufficient documentation

## 2016-03-30 DIAGNOSIS — O09299 Supervision of pregnancy with other poor reproductive or obstetric history, unspecified trimester: Secondary | ICD-10-CM | POA: Insufficient documentation

## 2016-03-31 LAB — BETA HCG QUANT (REF LAB): HCG QUANT: 41912 m[IU]/mL

## 2016-03-31 NOTE — Telephone Encounter (Signed)
Now she wants to know what her progesterone level was like

## 2016-04-03 ENCOUNTER — Other Ambulatory Visit: Payer: Self-pay | Admitting: Obstetrics and Gynecology

## 2016-04-03 DIAGNOSIS — O3680X0 Pregnancy with inconclusive fetal viability, not applicable or unspecified: Secondary | ICD-10-CM

## 2016-04-04 ENCOUNTER — Ambulatory Visit (INDEPENDENT_AMBULATORY_CARE_PROVIDER_SITE_OTHER): Payer: Medicaid Other

## 2016-04-04 DIAGNOSIS — O3680X0 Pregnancy with inconclusive fetal viability, not applicable or unspecified: Secondary | ICD-10-CM

## 2016-04-05 ENCOUNTER — Telehealth: Payer: Self-pay | Admitting: Obstetrics and Gynecology

## 2016-04-05 DIAGNOSIS — O09521 Supervision of elderly multigravida, first trimester: Secondary | ICD-10-CM

## 2016-04-05 NOTE — Telephone Encounter (Signed)
I told her that once we verified that her pregnancy was viable [facility]  She just had her viability scan yesterday afternoon which I hadn't had the chance to review prior to her calling.  But I will refer her now as it looks like the pregnancy is normal.

## 2016-04-05 NOTE — Telephone Encounter (Signed)
Patient called stating Dr Valentino Saxonherry was going to put in a referral for [facility] to be [facility]. I dont have any referral for her. Please Advise

## 2016-04-17 ENCOUNTER — Ambulatory Visit (HOSPITAL_BASED_OUTPATIENT_CLINIC_OR_DEPARTMENT_OTHER)
Admission: RE | Admit: 2016-04-17 | Discharge: 2016-04-17 | Disposition: A | Discharge status: Home / Self Care | Payer: Medicaid Other | Source: Ambulatory Visit | Attending: Maternal & Fetal Medicine | Admitting: Maternal & Fetal Medicine

## 2016-04-17 ENCOUNTER — Encounter: Payer: Self-pay | Admitting: *Deleted

## 2016-04-17 ENCOUNTER — Ambulatory Visit
Admission: RE | Admit: 2016-04-17 | Discharge: 2016-04-17 | Disposition: A | Discharge status: Home / Self Care | Payer: Medicaid Other | Source: Ambulatory Visit | Attending: Maternal & Fetal Medicine | Admitting: Maternal & Fetal Medicine

## 2016-04-17 VITALS — BP 114/70 | HR 84 | Temp 98.4°F | Wt 183.0 lb

## 2016-04-17 DIAGNOSIS — O09299 Supervision of pregnancy with other poor reproductive or obstetric history, unspecified trimester: Secondary | ICD-10-CM

## 2016-04-17 DIAGNOSIS — Z8759 Personal history of other complications of pregnancy, childbirth and the puerperium: Secondary | ICD-10-CM

## 2016-04-17 DIAGNOSIS — Z9049 Acquired absence of other specified parts of digestive tract: Secondary | ICD-10-CM | POA: Diagnosis not present

## 2016-04-17 DIAGNOSIS — Z98891 History of uterine scar from previous surgery: Secondary | ICD-10-CM

## 2016-04-17 DIAGNOSIS — O09521 Supervision of elderly multigravida, first trimester: Secondary | ICD-10-CM | POA: Diagnosis not present

## 2016-04-17 DIAGNOSIS — Z3A09 9 weeks gestation of pregnancy: Secondary | ICD-10-CM | POA: Insufficient documentation

## 2016-04-17 DIAGNOSIS — Z3A13 13 weeks gestation of pregnancy: Secondary | ICD-10-CM

## 2016-04-17 DIAGNOSIS — O24911 Unspecified diabetes mellitus in pregnancy, first trimester: Secondary | ICD-10-CM | POA: Diagnosis present

## 2016-04-17 DIAGNOSIS — Z9884 Bariatric surgery status: Secondary | ICD-10-CM

## 2016-04-17 DIAGNOSIS — Z9889 Other specified postprocedural states: Secondary | ICD-10-CM | POA: Diagnosis not present

## 2016-04-17 HISTORY — DX: Bipolar disorder, unspecified: F31.9

## 2016-04-17 LAB — GLUCOSE, CAPILLARY: Glucose-Capillary: 115 mg/dL — ABNORMAL HIGH (ref 65–99)

## 2016-04-17 NOTE — Progress Notes (Addendum)
[facility] Consultation   Chief Complaint: AMA and history of multiple abdominal surgeries   HPI: Ms. Melanie Nicholson is a 39 y.o. G8P4034 at [redacted]w[redacted]d by LMP and [redacted]w[redacted]d US performed at [facility] on 04/04/2016 is referred for consultation from Dr. Anika Cherry  for AMA and history of multiple abdominal surgeries.  Dr. Cherry has advised the patient that she will need further care at [facility].  The patient used to be followed by an endocrinologist for her RAI induced hypothyroidism, but she has only seen her family physician recently ([facility]).  She was tested last week and was told she needed to go up on her levothyroxine.  She is now taking 200 mcg per day since last week.  Obstetric History:  Obstetric History   G8   P4   T4   P0   A3   L4    SAB0   TAB0   Ectopic1   Multiple0   Live Births4     # Outcome Date GA Lbr Len/2nd Weight Sex Delivery Anes PTL Lv  8 Current           7 SAB 04/2010 [redacted]w[redacted]d         6 Ectopic 02/21/09          5 Term 02/12/07 [redacted]w[redacted]d  8 lb 9 oz (3.884 kg) F CS-Unspec   LIV  4 Term 12/10/00 [redacted]w[redacted]d  8 lb 1 oz (3.657 kg) F CS-Unspec   LIV  3 Term 12/31/98 [redacted]w[redacted]d  8 lb 5 oz (3.771 kg) M CS-Unspec   LIV  2 Term 04/24/97 [redacted]w[redacted]d  29 lb 9 oz (13.4 kg) F CS-Unspec  N LIV  1 SAB 1995 [redacted]w[redacted]d           Obstetric Comments  Laparotomy for ruptured ectopic with salpingectomy  Sab in 04/2010 followed by [facility]    Gynecologic History:   Last Pap at [facility] in the last 6 months.    Past Medical History: Patient  has a past medical history of ADD (attention deficit disorder); Anxiety; Bipolar affective (HCC); Depression; Diabetes mellitus without complication (HCC); Gastric bypass status for obesity; Insomnia; and Thyroid disease. Is hypothyroid now after being treated with RAI for hyperthyroidism.    Past Surgical History: She  has a past surgical history that includes Cesarean section; Ectopic pregnancy surgery; Cholecystectomy; Tonsillectomy; gastic bypass; and  Dilation and curettage of uterus. 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   Social History: Patient  reports that she quit smoking about 16 months ago. She has never used smokeless tobacco. She reports that she does not drink alcohol or use drugs. She is a homemaker.  Her husband is an asphalt paver.  Family History: family history includes Diabetes in her paternal grandmother; Heart disease in her father; Mental illness in her mother; Thyroid disease in her father.  Half sister has two children (a boy and a girl with a "chromosome" condition).  FOB had grandchild with gastroschisis.     Review of Systems The patient  Has sleeplessness, but no anxiety or depression.  She has some constipation.  She cannot tolerate much in her stomach and has symptoms of dumping syndrome.  She has some pelvic discomfort after urination.  Otherwise, a full 12 point review of systems was negative.  Physical Exam: BP 114/70   Pulse 84   Temp 98.4 F (36.9 C)   Wt 183 lb (83 kg)     LMP 02/12/2016   BMI 33.47 kg/m   Bedside US today - Positive fetal cardiac motion  Urine C & S negative 9/07/15/2015  Asessement:39 yo gravida 8 para 4034 at [redacted]w[redacted]d gestation with: 1. AMA + half sister with children with "chromosome" disorder 2. Obesity 3. History of gastric bypass  4. Hypothyroidism as a result of RAI 5. Multiple abdominal surgeries (C/S x 4, laparotomy for ruptured ectopic, Roux-en-Y gastric bypass) 6. Mood disorder and ADHD - stable off of meds 7. Previous diagnosis of diabetes prior to gastric bypass  8.  Insomnia 9. Constipation 10. Pelvic discomfort after urination - recent urine C & S negative 11. Routine OB care  Recommendations: 1. AMA + half sister with children with "chromosome" disorder -- The patient and her husband met with Deborah Wells, our genetic counselor today and elects cell free fetal DNA testing  -- Low dose ASA  81 mg qd starting at [redacted] weeks gestation 2. Obesity -- Monthly USs for growth starting at [redacted] weeks gestation, weekly testing starting at 36 weeks, and delivery at [redacted] weeks gestation 3. History of gastric bypass  -- CBC, vitamin B12 and folate levels with NOB labs 4. Hypothyroidism as a result of RAI -- TSH and free T4 with NOB labs -- Endocrinology consultation prn 5. Multiple abdominal surgeries (C/S x 4, laparotomy for ruptured ectopic, Roux-en-Y gastric bypass) -- Consider surgery consult in 3rd trimester 6. Mood disorder and ADHD - stable off of meds 7. Previous diagnosis of diabetes prior to gastric bypass -- HbA1C, CMP and P/C ratio with NOB labs 8.  Insomnia -- Unisom 2 hr prn sleep 9. Constipation -- Colace 100 mg po bid prn consitipation 10. Pelvic discomfort after urination - recent urine C & S negative 11. Routine OB care --  We will accept the patient of transfer of care to [facility] --  I will see her for her NOB visit at [facility] in 3 weeks.  US and nurse visit that day.   Total time spent with the patient was 40 minutes with greater than 50% spent in counseling and coordination of care.  We appreciate this referral.    H. , MD [facility]  Addendum: RBS 115.   H. , MD [facility]    

## 2016-04-17 NOTE — Addendum Note (Signed)
Encounter addended by: Lady DeutscherAndra Dawnette Mione, MD on: 04/17/2016  4:03 PM<BR>    Actions taken: Sign clinical note

## 2016-04-18 ENCOUNTER — Other Ambulatory Visit: Payer: Self-pay

## 2016-04-20 NOTE — Progress Notes (Addendum)
Referring Provider:  Vista Mink Length of Consultation: 50 minutes  Ms. Glymph was referred to [facility] for and MFM consultation and genetic counseling because of advanced maternal age and a history of multiple abdominal surgeries.  The patient will be 40 years old at the time of delivery.  She also has a significant family history of developmental concerns.  This note summarizes the information we discussed.    We explained that the chance of a chromosome abnormality increases with maternal age.  Chromosomes and examples of chromosome problems were reviewed.  Humans typically have 46 chromosomes in each cell, with half passed through each sperm and egg.  Any change in the number or structure of chromosomes can increase the risk of problems in the physical and mental development of a pregnancy.   Based upon age of the patient, the chance of any chromosome abnormality was 1 in 100. The chance of Down syndrome, the most common chromosome problem associated with maternal age, was 1 in 46.  The risk of chromosome problems is in addition to the 3% general population risk for birth defects and mental retardation.  The greatest chance, of course, is that the baby would be born in good health.  We discussed the following prenatal screening and testing options for this pregnancy:  First trimester screening, which includes nuchal translucency ultrasound screen and first trimester maternal serum marker screening.  The nuchal translucency has approximately an 80% detection rate for Down syndrome and can be positive for other chromosome abnormalities as well as heart defects.  When combined with a maternal serum marker screening, the detection rate is up to 90% for Down syndrome and up to 97% for trisomy 18.     The chorionic villus sampling procedure is available for first trimester chromosome analysis.  This involves the withdrawal of a small amount of chorionic villi (tissue  from the developing placenta).  Risk of pregnancy loss is estimated to be approximately 1 in 200 to 1 in 100 (0.5 to 1%).  There is approximately a 1% (1 in 100) chance that the CVS chromosome results will be unclear.  Chorionic villi cannot be tested for neural tube defects.     Maternal serum marker screening, a blood test that measures pregnancy proteins, can provide risk assessments for Down syndrome, trisomy 18, and open neural tube defects (spina bifida, anencephaly). Because it does not directly examine the fetus, it cannot positively diagnose or rule out these problems.  Targeted ultrasound uses high frequency sound waves to create an image of the developing fetus.  An ultrasound is often recommended as a routine means of evaluating the pregnancy.  It is also used to screen for fetal anatomy problems (for example, a heart defect) that might be suggestive of a chromosomal or other abnormality.   Amniocentesis involves the removal of a small amount of amniotic fluid from the sac surrounding the fetus with the use of a thin needle inserted through the maternal abdomen and uterus.  Ultrasound guidance is used throughout the procedure.  Fetal cells from amniotic fluid are directly evaluated and > 99.5% of chromosome problems and > 98% of open neural tube defects can be detected. This procedure is generally performed after the 15th week of pregnancy.  The main risks to this procedure include complications leading to miscarriage in less than 1 in 200 cases (0.5%).  We also reviewed the availability of cell free fetal DNA testing from maternal blood to determine whether or not the  baby may have either Down syndrome, trisomy 38, or trisomy 79.  This test utilizes a maternal blood sample and DNA sequencing technology to isolate circulating cell free fetal DNA from maternal plasma.  The fetal DNA can then be analyzed for DNA sequences that are derived from the three most common chromosomes involved in  aneuploidy, chromosomes 13, 18, and 21.  If the overall amount of DNA is greater than the expected level for any of these chromosomes, aneuploidy is suspected.  While we do not consider it a replacement for invasive testing and karyotype analysis, a negative result from this testing would be reassuring, though not a guarantee of a normal chromosome complement for the baby.  An abnormal result is certainly suggestive of an abnormal chromosome complement, though we would still recommend CVS or amniocentesis to confirm any findings from this testing.  Cystic Fibrosis and Spinal Muscular Atrophy (SMA) screening were also discussed with the patient. Both conditions are recessive, which means that both parents must be carriers in order to have a child with the disease.  Cystic fibrosis (CF) is one of the most common genetic conditions in persons of Caucasian ancestry.  This condition occurs in approximately 1 in 2,500 Caucasian persons and results in thickened secretions in the lungs, digestive, and reproductive systems.  For a baby to be at risk for having CF, both of the parents must be carriers for this condition.  Approximately 1 in 65 Caucasian persons is a carrier for CF.  Current carrier testing looks for the most common mutations in the gene for CF and can detect approximately 90% of carriers in the Caucasian population.  This means that the carrier screening can greatly reduce, but cannot eliminate, the chance for an individual to have a child with CF.  If an individual is found to be a carrier for CF, then carrier testing would be available for the partner. As part of [place] newborn screening profile, all babies born in the state of [place] will have a two-tier screening process.  Specimens are first tested to determine the concentration of immunoreactive trypsinogen (IRT).  The top 5% of specimens with the highest IRT values then undergo DNA testing using a panel of over 40 common CF  mutations. SMA is a neurodegenerative disorder that leads to atrophy of skeletal muscle and overall weakness.  This condition is also more prevalent in the Caucasian population, with 1 in 40-1 in 60 persons being a carrier and 1 in 6,000-1 in 10,000 children being affected.  There are multiple forms of the disease, with some causing death in infancy to other forms with survival into adulthood.  The genetics of SMA is complex, but carrier screening can detect up to 95% of carriers in the Caucasian population.  Similar to CF, a negative result can greatly reduce, but cannot eliminate, the chance to have a child with SMA.  The patient declined both CF and SMA carrier screening.  We obtained a detailed family history and pregnancy history and reviewed the family history provided during her genetic counseling visit in 2008.  The following concerns were reviewed: Marland Kitchen The father of the baby has a daughter who has a daughter with gastroschisis.  She is now 38 years old and may also have a brain malformation, though little information is known about her health condition of cognitive development.  Gastroschisis is an opening in the abdominal wall through which the bowel protrudes.   During early development, the internal organs of the abdominal  cavity are present outside the body.  They then rotate inward and the muscle and tissues grow together over them, closing the abdomen.  Gastroschisis occurs when there is a failure of this closure.  It is not known what specifically causes gastroschisis to occur, though it is currently thought to be a vascular disruption.  Gastroschisis is usually an isolated birth defect and generally has a good prognosis.  However, there is a possibility that gastroschisis can be associated with other birth defects or chromosome conditions.  As an isolated condition, the recurrence chance for gastroschisis in this pregnancy is likely very small.  However, if there were other birth defects or a  genetic syndrome present as the cause in the this relative, then this risk may be increased.  Medical record review could help to clarify this if desired.  We can use ultrasound in the second trimester to assess for abdominal wall defects. . The patient has a paternal half sister who has four children, three of whom are said to have developmental delays and dysmorphic features due to a "chromosome problem."  However, when we met in 2008 and again today, the patient is not aware of what that problem is specifically nor is she in contact with her sister so that records could be obtained.  Of note, they also have two paternal first cousins and their children who are reported to be "slow" learners.  We reviewed that without medical records, we cannot accurately determine the chance for this pregnancy to have developmental differences.  There could be a chromosome translocation inherited from the paternal side of the family that may explain the developmental and physical differences.  For this reason, we offered the patient a blood chromosome analysis to rule out a translocation in herself that would help determine if this pregnancy is at risk. She declined this option.   . The patient also mentioned that one of her doctors told her she may have Alpha-1-Antitrypsin, which is a genetic condition that may affect the lungs and liver.  We did not find any record of this testing in the electronic medical record today.  We recommend that the patient return to her physician who performed that testing for additional details and a plan for follow up and care as needed.  She stated that she has no liver or lung symptoms. . Her maternal grandmother passed away at 25 years old due to a blood clot.  Due to the fact that clotting disorders may have genetic components, Dr. Jeneen Rinks recommended a low dose 50m ASA daily starting at 12 weeks. . Lastly, the patient reported mental health conditions in herself and her mother.  She is not  taking medications currently for her bipolar or ADHD but feels she is "doing well" without them.  We discussed that mental health conditions may have both inherited and other factors, but that other family members including her children may be at increased risk for developing a similar condition.  Currently, there is no genetic testing to determine who may at risk.  Also, we encouraged Ms. Ostrum to stay in contact with her mental health provider, particularly if she has worsening of symptoms during or after pregnancy. . See the MFM note from today regarding the remainder of the patient's obstetric and medical history and recommendations.  After our conversation and consultation with Dr. JJeneen Rinks the patient was [facility] followed by brief  genetic counseling and first trimester ultrasound at 2:00.  She desires cell free DNA testing at that visit, as she was too early for that testing today.  Records from this visit will be faxed to [facility].  Ms. Mcphie was encouraged to call with questions or concerns.  We can be contacted at [phone].    Wilburt Finlay, MS, CGC  I was immediately available and supervising. Erasmo Score, MD [facility]  Erasmo Score, MD [facility]

## 2016-11-19 ENCOUNTER — Emergency Department: Payer: Medicaid Other

## 2016-11-19 ENCOUNTER — Emergency Department
Admission: EM | Admit: 2016-11-19 | Discharge: 2016-11-19 | Disposition: A | Discharge status: Home / Self Care | Payer: Medicaid Other | Attending: Emergency Medicine | Admitting: Emergency Medicine

## 2016-11-19 ENCOUNTER — Encounter: Payer: Self-pay | Admitting: Emergency Medicine

## 2016-11-19 DIAGNOSIS — R600 Localized edema: Secondary | ICD-10-CM | POA: Insufficient documentation

## 2016-11-19 DIAGNOSIS — Z87891 Personal history of nicotine dependence: Secondary | ICD-10-CM | POA: Diagnosis not present

## 2016-11-19 DIAGNOSIS — R609 Edema, unspecified: Secondary | ICD-10-CM

## 2016-11-19 DIAGNOSIS — E119 Type 2 diabetes mellitus without complications: Secondary | ICD-10-CM | POA: Insufficient documentation

## 2016-11-19 NOTE — Discharge Instructions (Signed)
Please follow up with your OB/GYN for further eval of your edema

## 2016-11-19 NOTE — ED Triage Notes (Signed)
Pt ambulatory with slow but steady gait, no distress noted. Pt c/o bilateral leg swelling with burning. Pt gave birth by cesarean on 11/13/16, incision is bandaged with pump in place. Pt denies pain in areas.

## 2016-11-19 NOTE — ED Provider Notes (Signed)
[facility] Emergency Department Provider Note   ____________________________________________   First MD Initiated Contact with Patient 11/19/16 [phone]     (approximate)  I have reviewed the triage vital signs and the nursing notes.   HISTORY  Chief Complaint Leg Swelling    HPI Melanie Nicholson is a 41 y.o. female who comes into the hospital today with some swelling in her legs. The patient reports that she feels like they're hot and itchy as well as very swollen. She noticed this on Thursday or Friday. The patient gave birth on Monday by C-section. She reports that she went home on Thursday and the swelling started. The patient states that this was her fifth baby and she's never had swelling like this before. She also reports that she's had no swelling in her legs before or leading up to the end of the pregnancy. The patient did not call her OB/GYN to discuss the swelling. She reports that she has been trying to elevate her legs some. The patient denies any chest pain or shortness of breath. She was concerned so she decided to come into the hospital today for evaluation of this leg swelling.   Past Medical History:  Diagnosis Date  . ADD (attention deficit disorder)   . Anxiety   . Bipolar affective (HCC)   . Depression   . Diabetes mellitus without complication (HCC)    The patient was treated with metformin prior to gastric bypass  . Gastric bypass status for obesity   . Insomnia   . Thyroid disease     Patient Active Problem List   Diagnosis Date Noted  . Pregnancy 03/30/2016  . History of miscarriage, currently pregnant 03/30/2016  . Advanced maternal age in multigravida 03/30/2016  . History of cesarean section 03/02/2016  . History of ectopic pregnancy 03/02/2016  . History of cholecystectomy 03/02/2016  . History of gastric bypass 03/02/2016    Past Surgical History:  Procedure Laterality Date  . CESAREAN SECTION     x4  .  CHOLECYSTECTOMY    . DILATION AND CURETTAGE OF UTERUS    . ECTOPIC PREGNANCY SURGERY    . gastic bypass     05/2014  . TONSILLECTOMY      Prior to Admission medications   Medication Sig Start Date End Date Taking? Authorizing Provider  ALPRAZolam Prudy Feeler(XANAX) 1 MG tablet Take 1 mg by mouth 3 (three) times daily as needed for anxiety.    [provider]  Cholecalciferol (VITAMIN D3) 5000 units TABS Take 5,000 Units by mouth daily.    [provider]  levothyroxine (SYNTHROID, LEVOTHROID) 200 MCG tablet Take 200 mcg by mouth daily before breakfast.    [provider]  lisdexamfetamine (VYVANSE) 70 MG capsule Take 70 mg by mouth daily.    [provider]  multivitamin (ONE-A-DAY MEN'S) TABS tablet Take 1 tablet by mouth daily.    [provider]  Prenatal Vit-Fe Fumarate-FA (MULTIVITAMIN-PRENATAL) 27-0.8 MG TABS tablet Take 1 tablet by mouth daily at 12 noon.    [provider]  QUEtiapine Fumarate (SEROQUEL XR) 150 MG 24 hr tablet Take 150 mg by mouth at bedtime.    [provider]  zolpidem (AMBIEN) 10 MG tablet Take 10 mg by mouth at bedtime as needed for sleep.    [provider]    Allergies Patient has no known allergies.  Family History  Problem Relation Age of Onset  . Mental illness Mother   . Heart disease  Father   . Thyroid disease Father   . Diabetes Paternal Grandmother   . Breast cancer Neg Hx   . Ovarian cancer Neg Hx   . Colon cancer Neg Hx     Social History Social History  Substance Use Topics  . Smoking status: Former Smoker    Quit date: 12/2014  . Smokeless tobacco: Never Used  . Alcohol use No     Comment: occas    Review of Systems  Constitutional: No fever/chills Eyes: No visual changes. ENT: No sore throat. Cardiovascular: Denies chest pain. Respiratory: Denies shortness of breath. Gastrointestinal: No abdominal pain.  No nausea, no vomiting.  No diarrhea.  No  constipation. Genitourinary: Negative for dysuria. Musculoskeletal: Negative for back pain. Skin: Negative for rash. Neurological: Negative for headaches, focal weakness or numbness. Lymph: peripheral edema  ____________________________________________   PHYSICAL EXAM:  VITAL SIGNS: ED Triage Vitals  Enc Vitals Group     BP 11/19/16 0220 132/74     Pulse Rate 11/19/16 0220 78     Resp 11/19/16 0220 16     Temp 11/19/16 0220 97.7 F (36.5 C)     Temp Source 11/19/16 0220 Oral     SpO2 11/19/16 0220 99 %     Weight 11/19/16 0222 240 lb (108.9 kg)     Height --      Head Circumference --      Peak Flow --      Pain Score --      Pain Loc --      Pain Edu? --      Excl. in GC? --     Constitutional: Alert and oriented. Well appearing and in mild distress. Eyes: Conjunctivae are normal. PERRL. EOMI. Head: Atraumatic. Nose: No congestion/rhinnorhea. Mouth/Throat: Mucous membranes are moist.  Oropharynx non-erythematous. Cardiovascular: Normal rate, regular rhythm. Grossly normal heart sounds.  Good peripheral circulation. Respiratory: Normal respiratory effort.  No retractions. Lungs CTAB. Gastrointestinal: Soft. No distention. Positive bowel sounds Musculoskeletal: mild non pitting lower extremity edema Neurologic:  Normal speech and language.  Skin:  Skin is warm, dry and intact.  Psychiatric: Mood and affect are normal.   ____________________________________________   LABS (all labs ordered are listed, but only abnormal results are displayed)  Labs Reviewed - No data to display ____________________________________________  EKG  none ____________________________________________  RADIOLOGY  US doppler b/l LE ____________________________________________   PROCEDURES  Procedure(s) performed: None  Procedures  Critical Care performed: No  ____________________________________________   INITIAL IMPRESSION / ASSESSMENT AND PLAN / ED COURSE  Pertinent  labs & imaging results that were available during my care of the patient were reviewed by me and considered in my medical decision making (see chart for details).  This is a 41 year old female who comes into the hospital today with some swelling to both of her legs. The patient delivered a baby proximally 5 days ago and reports that she is not used to having swelling to her legs. I told her that there is always a possibility for some postpartum edema especially she received some fluids after her surgery. I did send the patient for some Dopplers looking for possible blood clots in her legs but the Dopplers were negative. I informed the patient that the swelling should improve. She asked for some medication but she is nursing and I do not want the patient to decrease her supply nor to pass on an appropriate medications. The patient will be discharged home to follow-up with her OB/GYN  Clinical Course  as of Nov 19 820  Sun Nov 19, 2016  0710 No evidence of DVT within either lower extremity. US Venous Img Lower Bilateral [AW]    Clinical Course User Index [AW] Rebecka Apley, MD     ____________________________________________   FINAL CLINICAL IMPRESSION(S) / ED DIAGNOSES  Final diagnoses:  Peripheral edema      NEW MEDICATIONS STARTED DURING THIS VISIT:  Discharge Medication List as of 11/19/2016  7:35 AM       Note:  This document was prepared using Dragon voice recognition software and may include unintentional dictation errors.    Rebecka Apley, MD 11/19/16 [phone]

## 2016-11-19 NOTE — ED Notes (Signed)
Spoke with Zenda AlpersWebster, MD for order recommendations. No orders given at this time.

## 2016-11-19 NOTE — ED Notes (Signed)
Pt presents to ED 05 c/o leg swelling up to her hips; upon assessment no pitting edema noted; pt has good pedal and radial pulses, cap refill is less than <3 sec in all extremities; pt denies any loss of sensation; pt does state burning sensation in the feet; pt had a c-section on 11/13/16; pt denies any increased blood pressure during pregnancy; pt denies any shortness of breath, chest pain, light headedness, dizziness, nausea, vomiting, diarrhea; pt is awake, alert and oriented x4 and does not appear to be in acute distress at this time.

## 2018-07-25 ENCOUNTER — Encounter: Payer: Self-pay | Admitting: Emergency Medicine

## 2018-09-06 LAB — HM PAP SMEAR: HM Pap smear: NEGATIVE

## 2018-09-06 LAB — IRON,TIBC AND FERRITIN PANEL
Ferritin: 3.4
Iron: 22
TIBC: 415
UIBC: 393

## 2018-09-06 LAB — HM HIV SCREENING LAB: HM HIV Screening: NEGATIVE

## 2018-09-06 LAB — VITAMIN B12: Vitamin B-12: 800

## 2018-09-06 LAB — HM HEPATITIS C SCREENING LAB: HM Hepatitis Screen: NEGATIVE

## 2018-09-06 LAB — HIV ANTIBODY (ROUTINE TESTING W REFLEX)

## 2018-09-09 ENCOUNTER — Other Ambulatory Visit: Payer: Self-pay | Admitting: Family Medicine

## 2018-09-09 ENCOUNTER — Emergency Department (HOSPITAL_COMMUNITY): Payer: Medicaid Other

## 2018-09-09 ENCOUNTER — Other Ambulatory Visit: Payer: Self-pay

## 2018-09-09 ENCOUNTER — Emergency Department (HOSPITAL_COMMUNITY)
Admission: EM | Admit: 2018-09-09 | Discharge: 2018-09-09 | Disposition: A | Discharge status: Home / Self Care | Payer: Medicaid Other | Attending: Emergency Medicine | Admitting: Emergency Medicine

## 2018-09-09 DIAGNOSIS — Z87891 Personal history of nicotine dependence: Secondary | ICD-10-CM | POA: Diagnosis not present

## 2018-09-09 DIAGNOSIS — I10 Essential (primary) hypertension: Secondary | ICD-10-CM | POA: Diagnosis not present

## 2018-09-09 DIAGNOSIS — E119 Type 2 diabetes mellitus without complications: Secondary | ICD-10-CM | POA: Insufficient documentation

## 2018-09-09 DIAGNOSIS — R7989 Other specified abnormal findings of blood chemistry: Secondary | ICD-10-CM | POA: Insufficient documentation

## 2018-09-09 DIAGNOSIS — Z7984 Long term (current) use of oral hypoglycemic drugs: Secondary | ICD-10-CM | POA: Diagnosis not present

## 2018-09-09 DIAGNOSIS — N920 Excessive and frequent menstruation with regular cycle: Secondary | ICD-10-CM

## 2018-09-09 DIAGNOSIS — D500 Iron deficiency anemia secondary to blood loss (chronic): Secondary | ICD-10-CM

## 2018-09-09 DIAGNOSIS — N63 Unspecified lump in unspecified breast: Secondary | ICD-10-CM

## 2018-09-09 DIAGNOSIS — Z79899 Other long term (current) drug therapy: Secondary | ICD-10-CM | POA: Diagnosis not present

## 2018-09-09 LAB — BASIC METABOLIC PANEL
Anion gap: 7 (ref 5–15)
BUN: 7 mg/dL (ref 6–20)
CO2: 24 mmol/L (ref 22–32)
Calcium: 8.6 mg/dL — ABNORMAL LOW (ref 8.9–10.3)
Chloride: 110 mmol/L (ref 98–111)
Creatinine, Ser: 0.91 mg/dL (ref 0.44–1.00)
GFR calc non Af Amer: >60 mL/min (ref >60)
Glucose, Bld: 82 mg/dL (ref 70–99)
Potassium: 3.6 mmol/L (ref 3.5–5.1)
Sodium: 141 mmol/L (ref 135–145)

## 2018-09-09 LAB — CBC
HCT: 33.2 % — ABNORMAL LOW (ref 36.0–46.0)
Hemoglobin: 10.5 g/dL — ABNORMAL LOW (ref 12.0–15.0)
MCH: 27.8 pg (ref 26.0–34.0)
MCHC: 31.6 g/dL (ref 30.0–36.0)
MCV: 87.8 fL (ref 80.0–100.0)
NRBC: 0 % (ref 0.0–0.2)
Platelets: 210 10*3/uL (ref 150–400)
RBC: 3.78 MIL/uL — ABNORMAL LOW (ref 3.87–5.11)
RDW: 12.8 % (ref 11.5–15.5)
WBC: 5.7 10*3/uL (ref 4.0–10.5)

## 2018-09-09 LAB — I-STAT TROPONIN, ED: Troponin i, poc: 0 ng/mL (ref 0.00–0.08)

## 2018-09-09 LAB — T4, FREE: Free T4: 1.21 ng/dL (ref 0.82–1.77)

## 2018-09-09 LAB — I-STAT BETA HCG BLOOD, ED (MC, WL, AP ONLY)

## 2018-09-09 LAB — TSH: TSH: 2.005 u[IU]/mL (ref 0.350–4.500)

## 2018-09-09 MED ORDER — SODIUM CHLORIDE 0.9% FLUSH: 3.0000 mL | Freq: Once | INTRAVENOUS | Status: DC

## 2018-09-09 NOTE — Discharge Instructions (Addendum)
Continue taking iron supplementation.  Follow-up with OB/GYN and hematology.  Return to the emergency department for worsening symptoms or concerns.

## 2018-09-09 NOTE — ED Provider Notes (Signed)
[facility] EMERGENCY DEPARTMENT Provider Note   CSN: [id number] Arrival date & time: 09/09/18  1737    History   Chief Complaint Chief Complaint  Patient presents with  . Chest Pain  . Anemia  . Abnormal Lab    HPI Melanie Nicholson is a 43 y.o. female.     42 y/o female with a PMH of thyroid presents to the ED with a chief complaint of abnormal lab.  Patient reports she was called by her primary care physician today and told that her ferritin levels were low.  She reports for the past couple of months she has been feeling a chest pain which she reports as heaviness.  She had one episode similar to this on Saturday where she describes a pressure in the center of her chest along with some shortness of breath.  She also endorses some fatigue, headache along with a couple episodes like she felt like she was going to pass out.  Reports her symptoms are better when she is laying down and at rest but worse while she is trying to ambulate along with driving.  Reports that while she was driving today she felt slow to react.  She has previously been transfused during pregnancy due to anemia.  Patient reports some changes in her menstrual cycle, stating that she used to have her cycle for 4 days which now increased to lasting 7 days.  He has not taken any medication for relieving symptoms.  Denies any abdominal pain, vaginal bleeding, fever, vaginal discharge or urinary symptoms.     Past Medical History:  Diagnosis Date  . ADD (attention deficit disorder)   . Anxiety   . Bipolar affective (HCC)   . Depression   . Diabetes mellitus without complication (HCC)   . Diabetes mellitus without complication (HCC)    The patient was treated with metformin prior to gastric bypass  . Gastric bypass status for obesity   . Hypertension   . Insomnia   . Reflux   . Thyroid disease     Patient Active Problem List   Diagnosis Date Noted  . Pregnancy 03/30/2016  . History of  miscarriage, currently pregnant 03/30/2016  . Advanced maternal age in multigravida 03/30/2016  . History of cesarean section 03/02/2016  . History of ectopic pregnancy 03/02/2016  . History of cholecystectomy 03/02/2016  . History of gastric bypass 03/02/2016    Past Surgical History:  Procedure Laterality Date  . CESAREAN SECTION     x4  . CHOLECYSTECTOMY    . DILATION AND CURETTAGE OF UTERUS    . ECTOPIC PREGNANCY SURGERY    . GALLBLADDER SURGERY    . gastic bypass     05/2014  . TONSILLECTOMY       OB History    Gravida  8   Para  4   Term  4   Preterm  0   AB  3   Living  4     SAB  2   TAB  0   Ectopic  1   Multiple      Live Births  4        Obstetric Comments  Laparotomy for ruptured ectopic with salpingectomy Sab in 04/2010 followed by [facility]         Home Medications    Prior to Admission medications   Medication Sig Start Date End Date Taking? Authorizing Provider  ALPRAZolam Prudy Feeler) 1 MG tablet Take 1 mg by  mouth at bedtime as needed for anxiety.    [provider]  ALPRAZolam Prudy Feeler) 1 MG tablet Take 1 mg by mouth 3 (three) times daily as needed for anxiety.    [provider]  Cholecalciferol (VITAMIN D-3) 5000 UNITS TABS Take by mouth.    [provider]  Cholecalciferol (VITAMIN D3) 5000 units TABS Take 5,000 Units by mouth daily.    [provider]  levothyroxine (SYNTHROID, LEVOTHROID) 150 MCG tablet Take 150 mcg by mouth daily before breakfast.    [provider]  levothyroxine (SYNTHROID, LEVOTHROID) 200 MCG tablet Take 200 mcg by mouth daily before breakfast.    [provider]  lisdexamfetamine (VYVANSE) 70 MG capsule Take 70 mg by mouth daily.    [provider]  lisinopril (PRINIVIL,ZESTRIL) 5 MG tablet Take 5 mg by mouth daily.    [provider]  metFORMIN (GLUCOPHAGE) 1000 MG tablet Take 1,000 mg by mouth 2 (two) times daily with a meal.    [provider]  multivitamin (ONE-A-DAY MEN'S) TABS tablet Take 1 tablet by mouth daily.    [provider]  pantoprazole (PROTONIX) 40 MG tablet Take 40 mg by mouth daily.    [provider]  PARoxetine (PAXIL-CR) 12.5 MG 24 hr tablet Take 12.5 mg by mouth daily.    [provider]  pravastatin (PRAVACHOL) 40 MG tablet Take 40 mg by mouth daily.    [provider]  Prenatal Vit-Fe Fumarate-FA (MULTIVITAMIN-PRENATAL) 27-0.8 MG TABS tablet Take 1 tablet by mouth daily at 12 noon.    [provider]  QUEtiapine Fumarate (SEROQUEL XR) 150 MG 24 hr tablet Take 150 mg by mouth at bedtime.    [provider]  terbinafine (LAMISIL) 250 MG tablet Take 1 tablet (250 mg total) by mouth daily. 11/26/13   Hyatt, Max T, DPM  zolpidem (AMBIEN) 10 MG tablet Take 10 mg by mouth at bedtime as needed for sleep.    [provider]  zolpidem (AMBIEN) 10 MG tablet Take 10 mg by mouth at bedtime as needed for sleep.    [provider]    Family History Family History  Problem Relation Age of Onset  . Mental illness Mother   . Heart disease Father   . Thyroid disease Father   . Diabetes Paternal Grandmother   . Breast cancer Neg Hx   . Ovarian cancer Neg Hx   . Colon cancer Neg Hx     Social History Social History   Tobacco Use  . Smoking status: Former Smoker    Last attempt to quit: 12/2014    Years since quitting: 3.7  . Smokeless tobacco: Never Used  Substance Use Topics  . Alcohol use: No    Alcohol/week: 0.0 standard drinks    Comment: occas  . Drug use: No     Allergies   Patient has no known allergies.   Review of Systems Review of Systems  Constitutional: Positive for fatigue. Negative for chills and fever.  HENT: Negative for ear pain and sore throat.   Eyes: Negative for pain and visual disturbance.  Respiratory: Positive for shortness of breath. Negative for cough and wheezing.   Cardiovascular: Positive for  chest pain. Negative for palpitations.  Gastrointestinal: Negative for abdominal pain and vomiting.  Genitourinary: Negative for dysuria and hematuria.  Musculoskeletal: Negative for arthralgias and back pain.  Skin: Negative for color change and rash.  Neurological: Positive for headaches. Negative for seizures and syncope.  All other systems reviewed and are negative.    Physical Exam Updated Vital Signs BP 123/85   Pulse 87   Resp 17   LMP 09/04/2018   SpO2 100%   Physical Exam Vitals signs and nursing note reviewed.  Constitutional:      General: She is not in acute distress.    Appearance: She is well-developed.  HENT:     Head: Normocephalic and atraumatic.     Mouth/Throat:     Pharynx: No oropharyngeal exudate.  Eyes:     Pupils: Pupils are equal, round, and reactive to light.  Neck:     Musculoskeletal: Normal range of motion.  Cardiovascular:     Rate and Rhythm: Regular rhythm.     Heart sounds: Normal heart sounds.  Pulmonary:     Effort: Pulmonary effort is normal. No respiratory distress.     Breath sounds: Normal breath sounds.  Abdominal:     General: Bowel sounds are normal. There is no distension.     Palpations: Abdomen is soft.     Tenderness: There is no abdominal tenderness.  Musculoskeletal:        General: No tenderness or deformity.     Right lower leg: No edema.     Left lower leg: No edema.  Skin:    General: Skin is warm and dry.  Neurological:     Mental Status: She is alert and oriented to person, place, and time.      ED Treatments / Results  Labs (all labs ordered are listed, but only abnormal results are displayed) Labs Reviewed  BASIC METABOLIC PANEL - Abnormal; Notable for the following components:      Result Value   Calcium 8.6 (*)    All other components within normal limits  CBC - Abnormal; Notable for the following components:   RBC 3.78 (*)    Hemoglobin 10.5 (*)    HCT 33.2 (*)    All other components within  normal limits  TSH  T4, FREE  I-STAT TROPONIN, ED  I-STAT BETA HCG BLOOD, ED ([facility], [facility], [facility] ONLY)    EKG None  Radiology Dg Chest 2 View  Result Date: 09/09/2018 CLINICAL DATA:  Chest pain EXAM: CHEST - 2 VIEW COMPARISON:  04/13/2011 FINDINGS: The heart size and mediastinal contours are within normal limits. Both lungs are clear. Mild degenerative changes of the spine. IMPRESSION: No active cardiopulmonary disease. Electronically Signed   By: Jasmine Pang M.D.   On: 09/09/2018 19:21    Procedures Procedures (including critical care time)  Medications Ordered in ED Medications  sodium chloride flush (NS) 0.9 % injection 3 mL (has no administration in time range)     Initial Impression / Assessment and Plan / ED Course  I have reviewed the triage vital signs and the nursing notes.  Pertinent labs & imaging results that were available during my care of the patient were reviewed by me and considered in my medical decision making (see chart for details).      Patient presents with abnormal laboratory stating she has called by her PCP stating she had a low ferritin level, she reports having multiple episodes during the past weeks states she feels a bit off.  BMP showed no electrolyte abnormalities, calcium slightly decreased at 8.6.  CBC showed hemoglobin of 10.5 which is significantly decreased from her last visit 2 years ago which was around 12.  Troponin was negative, hCG was negative as well.  Due to patient symptoms  orthostatic vitals obtain along with TSH and T4 levels due to her thyroid disease.  Patient also reports feeling off while driving, reports she had no sense of direction but denies any headaches at this point or falls.  All signs are within normal limits.  Patient Care will be signed out to Dr. Ranae PalmsYelverton pending TSH levels along with improvement in symptoms.  Final Clinical Impressions(s) / ED Diagnoses   Final diagnoses:  None    ED Discharge Orders    None        Freddy JakschSoto, Guerry Covington, PA-C 09/09/18 2053    Loren RacerYelverton, David, MD 09/09/18 2237

## 2018-09-09 NOTE — ED Triage Notes (Signed)
Pt. Stated, Melanie Nicholson had some chest heaviness , headaches, and feel like Im going to pass out. Started a week or 2 . I also Im tired and my Dr. York Spaniel my Ferritin is low. And Im anemia. Ive felt weird while driving.

## 2018-09-11 ENCOUNTER — Other Ambulatory Visit: Payer: Self-pay

## 2018-09-11 ENCOUNTER — Encounter: Payer: Self-pay | Admitting: Oncology

## 2018-09-11 ENCOUNTER — Inpatient Hospital Stay: Payer: Medicaid Other | Attending: Oncology | Admitting: Oncology

## 2018-09-11 ENCOUNTER — Inpatient Hospital Stay: Payer: Medicaid Other

## 2018-09-11 VITALS — BP 128/93 | HR 86 | Temp 98.3°F | Resp 18 | Ht 62.0 in | Wt 184.8 lb

## 2018-09-11 DIAGNOSIS — D500 Iron deficiency anemia secondary to blood loss (chronic): Secondary | ICD-10-CM | POA: Diagnosis present

## 2018-09-11 DIAGNOSIS — N921 Excessive and frequent menstruation with irregular cycle: Secondary | ICD-10-CM | POA: Insufficient documentation

## 2018-09-11 DIAGNOSIS — Z9884 Bariatric surgery status: Secondary | ICD-10-CM

## 2018-09-11 DIAGNOSIS — D649 Anemia, unspecified: Secondary | ICD-10-CM

## 2018-09-11 HISTORY — DX: Iron deficiency anemia secondary to blood loss (chronic): D50.0

## 2018-09-11 LAB — CBC WITH DIFFERENTIAL/PLATELET
Abs Immature Granulocytes: 0.01 10*3/uL (ref 0.00–0.07)
BASOS ABS: 0 10*3/uL (ref 0.0–0.1)
Basophils Relative: 0 %
Eosinophils Absolute: 0.2 10*3/uL (ref 0.0–0.5)
Eosinophils Relative: 4 %
HCT: 31.4 % — ABNORMAL LOW (ref 36.0–46.0)
Hemoglobin: 10 g/dL — ABNORMAL LOW (ref 12.0–15.0)
Immature Granulocytes: 0 %
LYMPHS PCT: 42 %
Lymphs Abs: 1.9 10*3/uL (ref 0.7–4.0)
MCH: 27.5 pg (ref 26.0–34.0)
MCHC: 31.8 g/dL (ref 30.0–36.0)
MCV: 86.5 fL (ref 80.0–100.0)
Monocytes Absolute: 0.3 10*3/uL (ref 0.1–1.0)
Monocytes Relative: 7 %
Neutro Abs: 2.1 10*3/uL (ref 1.7–7.7)
Neutrophils Relative %: 47 %
Platelets: 196 10*3/uL (ref 150–400)
RBC: 3.63 MIL/uL — ABNORMAL LOW (ref 3.87–5.11)
RDW: 12.8 % (ref 11.5–15.5)
WBC: 4.6 10*3/uL (ref 4.0–10.5)
nRBC: 0 % (ref 0.0–0.2)

## 2018-09-11 LAB — FOLATE: FOLATE: 8.2 ng/mL (ref >5.9)

## 2018-09-11 LAB — IRON AND TIBC
Iron: 16 ug/dL — ABNORMAL LOW (ref 28–170)
Saturation Ratios: 4 % — ABNORMAL LOW (ref 10.4–31.8)
TIBC: 417 ug/dL (ref 250–450)
UIBC: 401 ug/dL

## 2018-09-11 LAB — TECHNOLOGIST SMEAR REVIEW: Plt Morphology: ADEQUATE

## 2018-09-11 LAB — FERRITIN: Ferritin: 4 ng/mL — ABNORMAL LOW (ref 11–307)

## 2018-09-11 LAB — VITAMIN B12: Vitamin B-12: 837 pg/mL (ref 180–914)

## 2018-09-11 NOTE — Progress Notes (Signed)
Hematology/Oncology Consult note [facility] Telephone:([phone] [phone]   Patient Care Team: Armando Gang, FNP as PCP - General (Family Medicine) Armando Gang, FNP (Family Medicine)  REFERRING PROVIDER: Armando Gang, FNP  CHIEF COMPLAINTS/REASON FOR VISIT:  Evaluation of iron deficiency anemia  HISTORY OF PRESENTING ILLNESS:  Melanie Nicholson is a  43 y.o.  female with PMH listed below who was referred to me for evaluation of iron deficiency anemia Reviewed patient's recent labs that was done at [facility] office.  08/20/2018 labs revealed anemia with hemoglobin of 11.3, MCV 87, WBC 4.3, platelet 220,000.  Differential showed increased lymphocyte percentage to 47.57% [17.9- 45.1] FSH 6.1, LH 6.6, vitamin D 25-hydroxy 26.7, Creatinine 1.93, calcium 8.7, AST 15, ALT 9, alkaline phosphatase 36, albumin 4.2, bilirubin 1.5 TSH 6.565, T3 273, T4 24.3. Patient also report having an iron level of 3.  Reviewed document faxed over from PCPs office.  I could not find any iron stores.  Patient does not have records of previous CBCs.  Not able to determine the onset.  Associated signs and symptoms: Patient reports fatigue.  Denies SOB with exertion.  Denies weight loss, easy bruising, hematochezia, hemoptysis, hematuria. Context:  History of iron deficiency: Recalls being iron deficient during pregnancy 2 years ago.  Received IV iron infusion at [facility] in the past. Rectal bleeding: Denies  menstrual bleeding/ Vaginal bleeding : She reports irregular and heavy flow menstrual.  The past several months. Hematemesis or hemoptysis : denies Blood in urine : denies  Pica: Craves ice chips Last endoscopy: Never had endoscopy done. Fatigue: Yes.  SOB: deneis  Also has a history of gastric bypass in November 2015.  She takes bariatric vitamins. Fatigue: reports worsening fatigue. Chronic onset, perisistent, no aggravating or improving factors, no associated  symptoms.   Review of Systems  Constitutional: Positive for fatigue. Negative for appetite change, chills and fever.  HENT:   Negative for hearing loss and voice change.   Eyes: Negative for eye problems.  Respiratory: Negative for chest tightness and cough.   Cardiovascular: Negative for chest pain.  Gastrointestinal: Negative for abdominal distention, abdominal pain and blood in stool.  Endocrine: Negative for hot flashes.  Genitourinary: Negative for difficulty urinating and frequency.   Musculoskeletal: Negative for arthralgias.  Skin: Negative for itching and rash.  Neurological: Negative for extremity weakness.  Hematological: Negative for adenopathy.  Psychiatric/Behavioral: Negative for confusion.    MEDICAL HISTORY:  Past Medical History:  Diagnosis Date  . ADD (attention deficit disorder)   . Anxiety   . Bipolar affective (HCC)   . Depression   . Gastric bypass status for obesity   . Insomnia   . Reflux   . Thyroid disease     SURGICAL HISTORY: Past Surgical History:  Procedure Laterality Date  . CESAREAN SECTION     x4  . CHOLECYSTECTOMY    . DILATION AND CURETTAGE OF UTERUS    . ECTOPIC PREGNANCY SURGERY    . GALLBLADDER SURGERY    . gastic bypass     05/2014  . TONSILLECTOMY      SOCIAL HISTORY: Social History   Socioeconomic History  . Marital status: Legally Separated    Spouse name: Not on file  . Number of children: Not on file  . Years of education: Not on file  . Highest education level: Not on file  Occupational History  . Not on file  Social Needs  . Financial resource strain: Not on  file  . Food insecurity:    Worry: Not on file    Inability: Not on file  . Transportation needs:    Medical: Not on file    Non-medical: Not on file  Tobacco Use  . Smoking status: Current Every Day Smoker    Types: Cigarettes  . Smokeless tobacco: Never Used  . Tobacco comment: 8 cigarretes a day  Substance and Sexual Activity  . Alcohol use: No     Alcohol/week: 0.0 standard drinks    Comment: occas  . Drug use: Yes    Types: Marijuana    Comment: "every now and then"  . Sexual activity: Yes    Birth control/protection: None  Lifestyle  . Physical activity:    Days per week: Not on file    Minutes per session: Not on file  . Stress: Not on file  Relationships  . Social connections:    Talks on phone: Not on file    Gets together: Not on file    Attends religious service: Not on file    Active member of club or organization: Not on file    Attends meetings of clubs or organizations: Not on file    Relationship status: Not on file  . Intimate partner violence:    Fear of current or ex partner: Not on file    Emotionally abused: Not on file    Physically abused: Not on file    Forced sexual activity: Not on file  Other Topics Concern  . Not on file  Social History Narrative   ** Merged History Encounter **        FAMILY HISTORY: Family History  Problem Relation Age of Onset  . Mental illness Mother   . Heart disease Father   . Thyroid disease Father   . Diabetes Paternal Grandmother   . Ovarian cancer Maternal Aunt   . Ovarian cancer Cousin   . Thyroid cancer Cousin   . Ovarian cancer Maternal Aunt   . Ovarian cancer Maternal Aunt   . Breast cancer Neg Hx   . Colon cancer Neg Hx     ALLERGIES:  is allergic to nsaids.  MEDICATIONS:  Current Outpatient Medications  Medication Sig Dispense Refill  . clonazePAM (KLONOPIN) 1 MG tablet Take 1 mg by mouth 2 (two) times daily.    Marland Kitchen LATUDA 80 MG TABS tablet Take 80 mg by mouth at bedtime.    Marland Kitchen levothyroxine (SYNTHROID, LEVOTHROID) 200 MCG tablet Take 200 mcg by mouth daily before breakfast.    . lisdexamfetamine (VYVANSE) 50 MG capsule Take 50 mg by mouth daily.    . Multiple Vitamins-Minerals (BARIATRIC MULTIVITAMINS/IRON) CAPS Take 1-2 capsules by mouth daily.    . phentermine (ADIPEX-P) 37.5 MG tablet Take 37.5 mg by mouth daily.    Marland Kitchen terbinafine (LAMISIL)  250 MG tablet Take 1 tablet (250 mg total) by mouth daily. (Patient not taking: Reported on 09/09/2018) 30 tablet 0   No current facility-administered medications for this visit.      PHYSICAL EXAMINATION: ECOG PERFORMANCE STATUS: 0 - Asymptomatic Vitals:   09/11/18 1400  BP: (!) 128/93  Pulse: 86  Resp: 18  Temp: 98.3 F (36.8 C)   Filed Weights   09/11/18 1400  Weight: 184 lb 12.8 oz (83.8 kg)    Physical Exam Constitutional:      General: She is not in acute distress. HENT:     Head: Normocephalic and atraumatic.  Eyes:     General: No  scleral icterus.    Pupils: Pupils are equal, round, and reactive to light.  Neck:     Musculoskeletal: Normal range of motion and neck supple.  Cardiovascular:     Rate and Rhythm: Normal rate and regular rhythm.     Heart sounds: Normal heart sounds.  Pulmonary:     Effort: Pulmonary effort is normal. No respiratory distress.     Breath sounds: No wheezing.  Abdominal:     General: Bowel sounds are normal. There is no distension.     Palpations: Abdomen is soft. There is no mass.     Tenderness: There is no abdominal tenderness.  Musculoskeletal: Normal range of motion.        General: No deformity.  Skin:    General: Skin is warm and dry.     Findings: No erythema or rash.  Neurological:     Mental Status: She is alert and oriented to person, place, and time.     Cranial Nerves: No cranial nerve deficit.     Coordination: Coordination normal.  Psychiatric:        Behavior: Behavior normal.        Thought Content: Thought content normal.       CMP Latest Ref Rng & Units 09/09/2018  Glucose 70 - 99 mg/dL 82  BUN 6 - 20 mg/dL 7  Creatinine 1.61 - 0.96 mg/dL 0.45  Sodium 409 - 811 mmol/L 141  Potassium 3.5 - 5.1 mmol/L 3.6  Chloride 98 - 111 mmol/L 110  CO2 22 - 32 mmol/L 24  Calcium 8.9 - 10.3 mg/dL 9.1(Y)  Total Protein 6.5 - 8.1 g/dL -  Total Bilirubin 0.3 - 1.2 mg/dL -  Alkaline Phos 38 - 782 U/L -  AST 15 - 41  U/L -  ALT 14 - 54 U/L -   CBC Latest Ref Rng & Units 09/11/2018  WBC 4.0 - 10.5 K/uL 4.6  Hemoglobin 12.0 - 15.0 g/dL 10.0(L)  Hematocrit 36.0 - 46.0 % 31.4(L)  Platelets 150 - 400 K/uL 196     LABORATORY DATA:  I have reviewed the data as listed Lab Results  Component Value Date   WBC 4.6 09/11/2018   HGB 10.0 (L) 09/11/2018   HCT 31.4 (L) 09/11/2018   MCV 86.5 09/11/2018   PLT 196 09/11/2018   Recent Labs    09/09/18 1842  NA 141  K 3.6  CL 110  CO2 24  GLUCOSE 82  BUN 7  CREATININE 0.91  CALCIUM 8.6*  GFRNONAA >60  GFRAA >60   Iron/TIBC/Ferritin/ %Sat    Component Value Date/Time   IRON 16 (L) 09/11/2018 1512   TIBC 417 09/11/2018 1512   FERRITIN 4 (L) 09/11/2018 1512   IRONPCTSAT 4 (L) 09/11/2018 1512     Dg Chest 2 View  Result Date: 09/09/2018 CLINICAL DATA:  Chest pain EXAM: CHEST - 2 VIEW COMPARISON:  04/13/2011 FINDINGS: The heart size and mediastinal contours are within normal limits. Both lungs are clear. Mild degenerative changes of the spine. IMPRESSION: No active cardiopulmonary disease. Electronically Signed   By: Jasmine Pang M.D.   On: 09/09/2018 19:21      ASSESSMENT & PLAN:  1. Iron deficiency anemia due to chronic blood loss   2. History of gastric bypass   3. Menorrhagia with irregular cycle    Labs are reviewed and discussed with patient.  Clinically she might have underlying iron deficiency secondary to menorrhagia. Recommend checking labs-CBC, smear, iron, TIBC, ferritin Given her  history of gastric bypass, I would also obtain vitamin B12, folate level.  Labs reviewed, Consistent with iron deficiency anemia.  Ferritin decreased at 4, TIBC 417.TSAT 4.  Folate 8.2.  Vitamin B12 pending. Plan IV iron with Feraheme 510mg  weekly x 2 doses. Allergy reactions/infusion reaction including anaphylactic reaction discussed with patient. Other side effects include but not limited to high blood pressure, skin rash, weight gain, leg swelling, etc.  Patient voices understanding and willing to proceed.  Menorrhagia, recommend patient to follow-up with GYN for further evaluation and management.  Orders Placed This Encounter  Procedures  . CBC with Differential/Platelet    Standing Status:   Future    Number of Occurrences:   1    Standing Expiration Date:   09/11/2019  . Iron and TIBC    Standing Status:   Future    Number of Occurrences:   1    Standing Expiration Date:   09/11/2019  . Ferritin    Standing Status:   Future    Number of Occurrences:   1    Standing Expiration Date:   09/11/2019  . Vitamin B12    Standing Status:   Future    Number of Occurrences:   1    Standing Expiration Date:   09/11/2019  . Folate    Standing Status:   Future    Number of Occurrences:   1    Standing Expiration Date:   09/11/2019  . CBC with Differential/Platelet    Standing Status:   Future    Standing Expiration Date:   09/11/2019  . Ferritin    Standing Status:   Future    Standing Expiration Date:   09/11/2019  . Iron and TIBC    Standing Status:   Future    Standing Expiration Date:   09/11/2019    All questions were answered. The patient knows to call the clinic with any problems questions or concerns.  Return of visit: 8 weeks with repeat labs, MD assessment. Thank you for this kind referral and the opportunity to participate in the care of this patient. A copy of today's note is routed to referring provider  Total face to face encounter time for this patient visit was . >50% of the time was  spent in counseling and coordination of care.    Rickard Patience, MD, PhD Hematology Oncology [facility] Pager- [phone] 09/11/2018

## 2018-09-11 NOTE — Progress Notes (Signed)
Patient here for initial visit. She states she has very heavy irregular periods. "longer in length and heavier."  States she felt heaviness in her chest this past Saturday. She stays cold and feel tired all the time.

## 2018-09-13 ENCOUNTER — Encounter: Payer: Self-pay | Admitting: Radiology

## 2018-09-13 ENCOUNTER — Ambulatory Visit
Admission: RE | Admit: 2018-09-13 | Discharge: 2018-09-13 | Disposition: A | Discharge status: Home / Self Care | Payer: Medicaid Other | Source: Ambulatory Visit | Attending: Family Medicine | Admitting: Family Medicine

## 2018-09-13 DIAGNOSIS — N63 Unspecified lump in unspecified breast: Secondary | ICD-10-CM | POA: Diagnosis present

## 2018-09-13 DIAGNOSIS — N6310 Unspecified lump in the right breast, unspecified quadrant: Secondary | ICD-10-CM | POA: Insufficient documentation

## 2018-09-18 ENCOUNTER — Other Ambulatory Visit: Payer: Self-pay | Admitting: Family Medicine

## 2018-09-18 DIAGNOSIS — N6310 Unspecified lump in the right breast, unspecified quadrant: Secondary | ICD-10-CM

## 2018-09-20 ENCOUNTER — Other Ambulatory Visit: Payer: Self-pay

## 2018-09-20 ENCOUNTER — Inpatient Hospital Stay: Payer: Medicaid Other

## 2018-09-20 VITALS — BP 121/89 | HR 84 | Resp 18

## 2018-09-20 DIAGNOSIS — D500 Iron deficiency anemia secondary to blood loss (chronic): Secondary | ICD-10-CM

## 2018-09-20 DIAGNOSIS — N921 Excessive and frequent menstruation with irregular cycle: Secondary | ICD-10-CM | POA: Diagnosis not present

## 2018-09-20 MED ORDER — SODIUM CHLORIDE 0.9 % IV SOLN
Freq: Once | INTRAVENOUS | Status: AC
  Administered 2018-09-20: 14:00:00 via INTRAVENOUS
  Filled 2018-09-20: qty 250

## 2018-09-20 MED ORDER — SODIUM CHLORIDE 0.9 % IV SOLN
510.0000 mg | Freq: Once | INTRAVENOUS | Status: AC
  Administered 2018-09-20: 510 mg via INTRAVENOUS
  Filled 2018-09-20: qty 17

## 2018-09-26 ENCOUNTER — Other Ambulatory Visit: Payer: Self-pay

## 2018-09-27 ENCOUNTER — Other Ambulatory Visit: Payer: Self-pay

## 2018-09-27 ENCOUNTER — Inpatient Hospital Stay: Payer: Medicaid Other | Attending: Oncology

## 2018-09-27 VITALS — BP 138/74 | HR 81 | Temp 97.4°F | Resp 18

## 2018-09-27 DIAGNOSIS — D500 Iron deficiency anemia secondary to blood loss (chronic): Secondary | ICD-10-CM | POA: Insufficient documentation

## 2018-09-27 DIAGNOSIS — N921 Excessive and frequent menstruation with irregular cycle: Secondary | ICD-10-CM | POA: Insufficient documentation

## 2018-09-27 MED ORDER — SODIUM CHLORIDE 0.9 % IV SOLN
510.0000 mg | Freq: Once | INTRAVENOUS | Status: AC
  Administered 2018-09-27: 510 mg via INTRAVENOUS
  Filled 2018-09-27: qty 17

## 2018-09-27 MED ORDER — SODIUM CHLORIDE 0.9 % IV SOLN
Freq: Once | INTRAVENOUS | Status: AC
  Administered 2018-09-27: 14:00:00 via INTRAVENOUS
  Filled 2018-09-27: qty 250

## 2018-11-03 ENCOUNTER — Other Ambulatory Visit: Payer: Self-pay

## 2018-11-04 ENCOUNTER — Inpatient Hospital Stay: Payer: Medicaid Other | Attending: Oncology | Admitting: *Deleted

## 2018-11-04 ENCOUNTER — Other Ambulatory Visit: Payer: Self-pay

## 2018-11-04 DIAGNOSIS — Z886 Allergy status to analgesic agent status: Secondary | ICD-10-CM | POA: Insufficient documentation

## 2018-11-04 DIAGNOSIS — Z79899 Other long term (current) drug therapy: Secondary | ICD-10-CM | POA: Diagnosis not present

## 2018-11-04 DIAGNOSIS — Z9884 Bariatric surgery status: Secondary | ICD-10-CM | POA: Insufficient documentation

## 2018-11-04 DIAGNOSIS — F17210 Nicotine dependence, cigarettes, uncomplicated: Secondary | ICD-10-CM | POA: Insufficient documentation

## 2018-11-04 DIAGNOSIS — N920 Excessive and frequent menstruation with regular cycle: Secondary | ICD-10-CM | POA: Diagnosis not present

## 2018-11-04 DIAGNOSIS — Z833 Family history of diabetes mellitus: Secondary | ICD-10-CM | POA: Diagnosis not present

## 2018-11-04 DIAGNOSIS — Z8041 Family history of malignant neoplasm of ovary: Secondary | ICD-10-CM | POA: Diagnosis not present

## 2018-11-04 DIAGNOSIS — D500 Iron deficiency anemia secondary to blood loss (chronic): Secondary | ICD-10-CM

## 2018-11-04 DIAGNOSIS — F419 Anxiety disorder, unspecified: Secondary | ICD-10-CM | POA: Diagnosis not present

## 2018-11-04 DIAGNOSIS — Z800 Family history of malignant neoplasm of digestive organs: Secondary | ICD-10-CM | POA: Diagnosis not present

## 2018-11-04 DIAGNOSIS — Z79890 Hormone replacement therapy (postmenopausal): Secondary | ICD-10-CM | POA: Diagnosis not present

## 2018-11-04 DIAGNOSIS — Z818 Family history of other mental and behavioral disorders: Secondary | ICD-10-CM | POA: Insufficient documentation

## 2018-11-04 DIAGNOSIS — G4700 Insomnia, unspecified: Secondary | ICD-10-CM | POA: Diagnosis not present

## 2018-11-04 DIAGNOSIS — Z9049 Acquired absence of other specified parts of digestive tract: Secondary | ICD-10-CM | POA: Diagnosis not present

## 2018-11-04 DIAGNOSIS — F319 Bipolar disorder, unspecified: Secondary | ICD-10-CM | POA: Insufficient documentation

## 2018-11-04 LAB — CBC WITH DIFFERENTIAL/PLATELET
Abs Immature Granulocytes: 0.02 10*3/uL (ref 0.00–0.07)
Basophils Absolute: 0 10*3/uL (ref 0.0–0.1)
Basophils Relative: 0 %
Eosinophils Absolute: 0.3 10*3/uL (ref 0.0–0.5)
Eosinophils Relative: 4 %
HCT: 39.5 % (ref 36.0–46.0)
Hemoglobin: 13.1 g/dL (ref 12.0–15.0)
Immature Granulocytes: 0 %
Lymphocytes Relative: 39 %
Lymphs Abs: 2.3 10*3/uL (ref 0.7–4.0)
MCH: 29.2 pg (ref 26.0–34.0)
MCHC: 33.2 g/dL (ref 30.0–36.0)
MCV: 88.2 fL (ref 80.0–100.0)
Monocytes Absolute: 0.5 10*3/uL (ref 0.1–1.0)
Monocytes Relative: 8 %
Neutro Abs: 2.8 10*3/uL (ref 1.7–7.7)
Neutrophils Relative %: 49 %
Platelets: 185 10*3/uL (ref 150–400)
RBC: 4.48 MIL/uL (ref 3.87–5.11)
RDW: 14.4 % (ref 11.5–15.5)
WBC: 5.9 10*3/uL (ref 4.0–10.5)
nRBC: 0 % (ref 0.0–0.2)

## 2018-11-04 LAB — IRON AND TIBC
Iron: 64 ug/dL (ref 28–170)
Saturation Ratios: 21 % (ref 10.4–31.8)
TIBC: 302 ug/dL (ref 250–450)
UIBC: 238 ug/dL

## 2018-11-04 LAB — FERRITIN: Ferritin: 60 ng/mL (ref 11–307)

## 2018-11-05 ENCOUNTER — Inpatient Hospital Stay (HOSPITAL_BASED_OUTPATIENT_CLINIC_OR_DEPARTMENT_OTHER): Payer: Medicaid Other | Admitting: Oncology

## 2018-11-05 ENCOUNTER — Encounter: Payer: Self-pay | Admitting: Oncology

## 2018-11-05 ENCOUNTER — Inpatient Hospital Stay: Payer: Medicaid Other

## 2018-11-05 DIAGNOSIS — D500 Iron deficiency anemia secondary to blood loss (chronic): Secondary | ICD-10-CM | POA: Diagnosis not present

## 2018-11-05 DIAGNOSIS — N921 Excessive and frequent menstruation with irregular cycle: Secondary | ICD-10-CM

## 2018-11-05 DIAGNOSIS — Z9884 Bariatric surgery status: Secondary | ICD-10-CM

## 2018-11-05 NOTE — Progress Notes (Signed)
Patient contacted via phone for virtual visit. No concerns voiced.  

## 2018-11-05 NOTE — Progress Notes (Signed)
HEMATOLOGY-ONCOLOGY TeleHEALTH VISIT PROGRESS NOTE  I connected with Melanie Nicholson on 11/05/18 at  1:30 PM EDT by video enabled telemedicine visit and verified that I am speaking with the correct person using two identifiers. I discussed the limitations, risks, security and privacy concerns of performing an evaluation and management service by telemedicine and the availability of in-person appointments. I also discussed with the patient that there may be a patient responsible charge related to this service. The patient expressed understanding and agreed to proceed.   Other persons participating in the visit and their role in the encounter:  Diamantina Monks, CMA, check in patient   Merleen Nicely, RN, check in patient.   Patient's location: Home  Provider's location: home  Chief Complaint: Follow-up for management of iron deficiency anemia, history of gastric bypass, menorrhagia with irregular cycle.   INTERVAL HISTORY Melanie Nicholson is a 43 y.o. female who has above history reviewed by me today presents for follow up visit for management of iron deficiency anemia, history of gastric bypass, menorrhagia with irregular cycle.  Problems and complaints are listed below:  Patient was seen by me on 09/11/2018 received Feraheme weekly x2. Reports feeling much better.  Fatigue has resolved.  Doing well today. No new concerns today. Review of Systems  Constitutional: Negative for appetite change, chills, fatigue and fever.  HENT:   Negative for hearing loss and voice change.   Eyes: Negative for eye problems.  Respiratory: Negative for chest tightness and cough.   Cardiovascular: Negative for chest pain.  Gastrointestinal: Negative for abdominal distention, abdominal pain and blood in stool.  Endocrine: Negative for hot flashes.  Genitourinary: Negative for difficulty urinating and frequency.   Musculoskeletal: Negative for arthralgias.  Skin: Negative for itching and rash.  Neurological:  Negative for extremity weakness.  Hematological: Negative for adenopathy.  Psychiatric/Behavioral: Negative for confusion.    Past Medical History:  Diagnosis Date  . ADD (attention deficit disorder)   . Anxiety   . Bipolar affective (HCC)   . Depression   . Gastric bypass status for obesity   . Insomnia   . Iron deficiency anemia due to chronic blood loss 09/11/2018  . Reflux   . Thyroid disease    Past Surgical History:  Procedure Laterality Date  . CESAREAN SECTION     x4  . CHOLECYSTECTOMY    . DILATION AND CURETTAGE OF UTERUS    . ECTOPIC PREGNANCY SURGERY    . GALLBLADDER SURGERY    . gastic bypass     05/2014  . TONSILLECTOMY      Family History  Problem Relation Age of Onset  . Mental illness Mother   . Heart disease Father   . Thyroid disease Father   . Diabetes Paternal Grandmother   . Ovarian cancer Maternal Aunt   . Ovarian cancer Cousin   . Thyroid cancer Cousin   . Ovarian cancer Maternal Aunt   . Ovarian cancer Maternal Aunt   . Breast cancer Neg Hx   . Colon cancer Neg Hx     Social History   Socioeconomic History  . Marital status: Legally Separated    Spouse name: Not on file  . Number of children: Not on file  . Years of education: Not on file  . Highest education level: Not on file  Occupational History  . Not on file  Social Needs  . Financial resource strain: Not on file  . Food insecurity:    Worry: Not on file  Inability: Not on file  . Transportation needs:    Medical: Not on file    Non-medical: Not on file  Tobacco Use  . Smoking status: Current Every Day Smoker    Types: Cigarettes  . Smokeless tobacco: Never Used  . Tobacco comment: 8 cigarretes a day  Substance and Sexual Activity  . Alcohol use: No    Alcohol/week: 0.0 standard drinks    Comment: occas  . Drug use: Yes    Types: Marijuana    Comment: "every now and then"  . Sexual activity: Yes    Birth control/protection: None  Lifestyle  . Physical activity:     Days per week: Not on file    Minutes per session: Not on file  . Stress: Not on file  Relationships  . Social connections:    Talks on phone: Not on file    Gets together: Not on file    Attends religious service: Not on file    Active member of club or organization: Not on file    Attends meetings of clubs or organizations: Not on file    Relationship status: Not on file  . Intimate partner violence:    Fear of current or ex partner: Not on file    Emotionally abused: Not on file    Physically abused: Not on file    Forced sexual activity: Not on file  Other Topics Concern  . Not on file  Social History Narrative   ** Merged History Encounter **        Current Outpatient Medications on File Prior to Visit  Medication Sig Dispense Refill  . clonazePAM (KLONOPIN) 1 MG tablet Take 1 mg by mouth 2 (two) times daily.    Marland Kitchen. LATUDA 80 MG TABS tablet Take 80 mg by mouth at bedtime.    Marland Kitchen. levothyroxine (SYNTHROID, LEVOTHROID) 200 MCG tablet Take 200 mcg by mouth daily before breakfast.    . lisdexamfetamine (VYVANSE) 50 MG capsule Take 50 mg by mouth daily.    . Multiple Vitamins-Minerals (BARIATRIC MULTIVITAMINS/IRON) CAPS Take 1-2 capsules by mouth daily.    . phentermine (ADIPEX-P) 37.5 MG tablet Take 37.5 mg by mouth daily.    Marland Kitchen. terbinafine (LAMISIL) 250 MG tablet Take 1 tablet (250 mg total) by mouth daily. (Patient not taking: Reported on 09/09/2018) 30 tablet 0   No current facility-administered medications on file prior to visit.     Allergies  Allergen Reactions  . Nsaids Other (See Comments)    History of gastric bypass       Observations/Objective: There were no vitals filed for this visit. There is no height or weight on file to calculate BMI.  Physical Exam  Constitutional: She is oriented to person, place, and time and well-developed, well-nourished, and in no distress. No distress.  HENT:  Head: Normocephalic.  Pulmonary/Chest: Effort normal.  Neurological:  She is alert and oriented to person, place, and time.  Psychiatric: Affect normal.    CBC    Component Value Date/Time   WBC 5.9 11/04/2018 1405   RBC 4.48 11/04/2018 1405   HGB 13.1 11/04/2018 1405   HGB 13.4 11/06/2014 1423   HCT 39.5 11/04/2018 1405   HCT 40.6 11/06/2014 1423   PLT 185 11/04/2018 1405   PLT 146 (L) 11/06/2014 1423   MCV 88.2 11/04/2018 1405   MCV 91 11/06/2014 1423   MCH 29.2 11/04/2018 1405   MCHC 33.2 11/04/2018 1405   RDW 14.4 11/04/2018 1405   RDW  13.5 11/06/2014 1423   LYMPHSABS 2.3 11/04/2018 1405   LYMPHSABS 2.4 11/06/2014 1423   MONOABS 0.5 11/04/2018 1405   MONOABS 0.4 11/06/2014 1423   EOSABS 0.3 11/04/2018 1405   EOSABS 0.1 11/06/2014 1423   BASOSABS 0.0 11/04/2018 1405   BASOSABS 0.0 11/06/2014 1423    CMP     Component Value Date/Time   NA 141 09/09/2018 1842   NA 138 11/06/2014 1423   K 3.6 09/09/2018 1842   K 3.7 11/06/2014 1423   CL 110 09/09/2018 1842   CL 108 11/06/2014 1423   CO2 24 09/09/2018 1842   CO2 23 11/06/2014 1423   GLUCOSE 82 09/09/2018 1842   GLUCOSE 97 11/06/2014 1423   BUN 7 09/09/2018 1842   BUN 11 11/06/2014 1423   CREATININE 0.91 09/09/2018 1842   CREATININE 0.78 11/06/2014 1423   CALCIUM 8.6 (L) 09/09/2018 1842   CALCIUM 9.0 11/06/2014 1423   PROT 6.2 (L) 03/24/2016 1002   ALBUMIN 3.8 03/24/2016 1002   AST 23 03/24/2016 1002   ALT 23 03/24/2016 1002   ALKPHOS 28 (L) 03/24/2016 1002   BILITOT 0.8 03/24/2016 1002   GFRNONAA >60 09/09/2018 1842   GFRNONAA >60 11/06/2014 1423   GFRAA >60 09/09/2018 1842   GFRAA >60 11/06/2014 1423     Assessment and Plan: 1. Iron deficiency anemia due to chronic blood loss   2. History of gastric bypass   3. Menorrhagia with irregular cycle     Labs are reviewed and discussed with patient.  Hemoglobin stable at 13.1.  Iron panel reveals iron saturation 21, iron 64, ferritin 60.  TIBC 300 02. No need for additional IV iron at this point. Given her history of gastric  bypass and ongoing menorrhagia, she was at high risk of developing recurrent iron deficiency. Recommend to repeat labs and follow-up in 4 months.   Follow Up Instructions: Lab MD in 4 months, check CBC, CMP, iron, TIBC, ferritin.  B12 and folate.   I discussed the assessment and treatment plan with the patient. The patient was provided an opportunity to ask questions and all were answered. The patient agreed with the plan and demonstrated an understanding of the instructions.  The patient was advised to call back or seek an in-person evaluation if the symptoms worsen or if the condition fails to improve as anticipated.   I provided 15 minutes of face-to-face video visit time during this encounter, and > 50% was spent counseling as documented under my assessment & plan.  Rickard Patience, MD 11/05/2018 11:13 PM

## 2019-01-28 ENCOUNTER — Encounter: Payer: Self-pay | Admitting: Certified Nurse Midwife

## 2019-01-28 ENCOUNTER — Other Ambulatory Visit: Payer: Self-pay

## 2019-01-28 ENCOUNTER — Ambulatory Visit (INDEPENDENT_AMBULATORY_CARE_PROVIDER_SITE_OTHER): Payer: Medicaid Other | Admitting: Certified Nurse Midwife

## 2019-01-28 VITALS — BP 119/84 | HR 80 | Ht 62.0 in | Wt 169.0 lb

## 2019-01-28 DIAGNOSIS — N939 Abnormal uterine and vaginal bleeding, unspecified: Secondary | ICD-10-CM

## 2019-01-28 NOTE — Progress Notes (Signed)
GYN ENCOUNTER NOTE  Subjective:       Melanie Nicholson is a 43 y.o. [id number] female is here for gynecologic evaluation of the following issues:  1. Heavy periods for the past 2 yrs that have worsened. She states she has always had heavy periods but they have worsened since having her son 2 yrs ago and getting her tubes tied. She has fatigue and anemia that she has had treated with iron transfusion.   Gynecologic History Patient's last menstrual period was 01/12/2019 (approximate). Contraception: tubal ligation Last Pap: 09/06/18. Results were: normal Last mammogram: 09/13/18 Results;}Two probably benign masses in the lateral inferior right breast, probably small intramammary lymph nodes  Obstetric History OB History  Gravida Para Term Preterm AB Living  8 5 5  0 3 5  SAB TAB Ectopic Multiple Live Births  2 0 1   5    # Outcome Date GA Lbr Len/2nd Weight Sex Delivery Anes PTL Lv  8 Term 11/13/16   8 lb 14 oz (4.026 kg) M CS-Unspec  N LIV  7 SAB 04/2010 [redacted]w[redacted]d         6 Ectopic 02/21/09          5 Term 02/12/07 [redacted]w[redacted]d  8 lb 9 oz (3.884 kg) F CS-Unspec   LIV  4 Term 12/10/00 [redacted]w[redacted]d  8 lb 1 oz (3.657 kg) F CS-Unspec   LIV  3 Term 12/31/98 [redacted]w[redacted]d  8 lb 5 oz (3.771 kg) M CS-Unspec   LIV  2 Term 04/24/97 [redacted]w[redacted]d  9 lb 9 oz (4.338 kg) F CS-Unspec  N LIV  1 SAB 1995            Obstetric Comments  Laparotomy for ruptured ectopic with salpingectomy  Sab in 04/2010 followed by [facility]    Past Medical History:  Diagnosis Date  . ADD (attention deficit disorder)   . Anxiety   . Bipolar affective ([place])   . Depression   . Gastric bypass status for obesity   . Insomnia   . Iron deficiency anemia due to chronic blood loss 09/11/2018  . Reflux   . Thyroid disease     Past Surgical History:  Procedure Laterality Date  . CESAREAN SECTION     x4  . CHOLECYSTECTOMY    . DILATION AND CURETTAGE OF UTERUS    . ECTOPIC PREGNANCY SURGERY    . GALLBLADDER SURGERY    . gastic bypass     05/2014  .  TONSILLECTOMY      Current Outpatient Medications on File Prior to Visit  Medication Sig Dispense Refill  . clonazePAM (KLONOPIN) 1 MG tablet Take 1 mg by mouth 2 (two) times daily.    Marland Kitchen LATUDA 80 MG TABS tablet Take 80 mg by mouth at bedtime.    Marland Kitchen levothyroxine (SYNTHROID, LEVOTHROID) 200 MCG tablet Take 200 mcg by mouth daily before breakfast.    . lisdexamfetamine (VYVANSE) 50 MG capsule Take 50 mg by mouth daily.    . Multiple Vitamins-Minerals (BARIATRIC MULTIVITAMINS/IRON) CAPS Take 1-2 capsules by mouth daily.    . phentermine (ADIPEX-P) 37.5 MG tablet Take 37.5 mg by mouth daily.    Marland Kitchen terbinafine (LAMISIL) 250 MG tablet Take 1 tablet (250 mg total) by mouth daily. (Patient not taking: Reported on 09/09/2018) 30 tablet 0   No current facility-administered medications on file prior to visit.     Allergies  Allergen Reactions  . Nsaids Other (See Comments)    History of gastric bypass  Social History   Socioeconomic History  . Marital status: Legally Separated    Spouse name: Not on file  . Number of children: Not on file  . Years of education: Not on file  . Highest education level: Not on file  Occupational History  . Not on file  Social Needs  . Financial resource strain: Not on file  . Food insecurity    Worry: Not on file    Inability: Not on file  . Transportation needs    Medical: Not on file    Non-medical: Not on file  Tobacco Use  . Smoking status: Current Every Day Smoker    Types: Cigarettes  . Smokeless tobacco: Never Used  . Tobacco comment: 8 cigarretes a day  Substance and Sexual Activity  . Alcohol use: Yes    Alcohol/week: 0.0 standard drinks    Comment: occas  . Drug use: Yes    Types: Marijuana    Comment: "every now and then"  . Sexual activity: Yes    Birth control/protection: None  Lifestyle  . Physical activity    Days per week: Not on file    Minutes per session: Not on file  . Stress: Not on file  Relationships  . Social  Musicianconnections    Talks on phone: Not on file    Gets together: Not on file    Attends religious service: Not on file    Active member of club or organization: Not on file    Attends meetings of clubs or organizations: Not on file    Relationship status: Not on file  . Intimate partner violence    Fear of current or ex partner: Not on file    Emotionally abused: Not on file    Physically abused: Not on file    Forced sexual activity: Not on file  Other Topics Concern  . Not on file  Social History Narrative   ** Merged History Encounter **        Family History  Problem Relation Age of Onset  . Mental illness Mother   . Heart disease Father   . Thyroid disease Father   . Diabetes Paternal Grandmother   . Ovarian cancer Maternal Aunt   . Ovarian cancer Cousin   . Thyroid cancer Cousin   . Ovarian cancer Maternal Aunt   . Ovarian cancer Maternal Aunt   . Breast cancer Neg Hx   . Colon cancer Neg Hx     The following portions of the patient's history were reviewed and updated as appropriate: allergies, current medications, past family history, past medical history, past social history, past surgical history and problem list.  Review of Systems Review of Systems - Negative except as mentioned in HPI Review of Systems - General ROS: negative for - chills, fatigue, fever, hot flashes, malaise or night sweats Hematological and Lymphatic ROS: negative for - bleeding problems or swollen lymph nodes Gastrointestinal ROS: negative for - abdominal pain, blood in stools, change in bowel habits and nausea/vomiting Musculoskeletal ROS: negative for - joint pain, muscle pain or muscular weakness Genito-Urinary ROS: negative for - change in menstrual cycle, dysmenorrhea, dyspareunia, dysuria, genital discharge, genital ulcers, hematuria, incontinence, irregular/heavy menses, nocturia or pelvic painjj  Objective:   BP 119/84   Pulse 80   Ht 5\' 2"  (1.575 m)   Wt 169 lb (76.7 kg)   LMP  01/12/2019 (Approximate)   Breastfeeding No   BMI 30.91 kg/m  CONSTITUTIONAL: Well-developed, well-nourished female in no  acute distress.  HENT:  Normocephalic, atraumatic.  NECK: Normal range of motion, supple, no masses.  Normal thyroid.  SKIN: Skin is warm and dry. No rash noted. Not diaphoretic. No erythema. No pallor. NEUROLGIC: Alert and oriented to person, place, and time.  PSYCHIATRIC: Normal mood and affect. Normal behavior. Normal judgment and thought content. CARDIOVASCULAR:Not Examined RESPIRATORY: Not Examined BREASTS: Not Examined ABDOMEN: Soft, non distended; Non tender.  No Organomegaly. PELVIC:not indicated MUSCULOSKELETAL: Normal range of motion. No tenderness.  No cyanosis, clubbing, or edema.   Assessment:   Abnormal Uterine bleeding     Plan:   Discussed abnormal uterine bleeding . Ultrasound ordered. Discussed use of birth control to help control bleeding. Reviewed patch, pill, ring, IUD, Nexplanon, and delpo injections. She declines IUD- had one in the past that she did not like, declines implant -does not want something in her arm, Declines depo injection. Stating it caused her to gain weight. Estrogen /progestrin methods discussed- she is older than 2435 and is a smoker. Discussed increased risk for clots. She declines any of those methods. Reviewed Lysteda, given her clotting risks she declines. Discussed ablation. She seems interested and asks about hysterectomy. Pamphlet given. Pt encouraged to see MD for further discussion of ablation vs hysterectomy. She verbalizes and agrees to plan of care. Follow up with MD PRN.   I attest more than 50% of visit spent reviewing history, discussing abnormal uterine bleeding, discussing treatment options as listed above, and answering all of her questions. Face to face time 20 min.   Doreene BurkeAnnie Moni Rothrock, CNM

## 2019-01-28 NOTE — Patient Instructions (Signed)
Menorrhagia Menorrhagia is when your menstrual periods are heavy or last longer than normal. Follow these instructions at home: Medicines   Take over-the-counter and prescription medicines exactly as told by your doctor. This includes iron pills.  Do not change or switch medicines without asking your doctor.  Do not take aspirin or medicines that contain aspirin 1 week before or during your period. Aspirin may make bleeding worse. General instructions  If you need to change your pad or tampon more than once every 2 hours, limit your activity until the bleeding stops.  Iron pills can cause problems when pooping (constipation). To prevent or treat pooping problems while taking prescription iron pills, your doctor may suggest that you: ? Drink enough fluid to keep your pee (urine) clear or pale yellow. ? Take over-the-counter or prescription medicines. ? Eat foods that are high in fiber. These foods include: ? Fresh fruits and vegetables. ? Whole grains. ? Beans. ? Limit foods that are high in fat and processed sugars. This includes fried and sweet foods.  Eat healthy meals and foods that are high in iron. Foods that have a lot of iron include: ? Leafy green vegetables. ? Meat. ? Liver. ? Eggs. ? Whole grain breads and cereals.  Do not try to lose weight until your heavy bleeding has stopped and you have normal amounts of iron in your blood. If you need to lose weight, work with your doctor.  Keep all follow-up visits as told by your doctor. This is important. Contact a doctor if:  You soak through a pad or tampon every 1 or 2 hours, and this happens every time you have a period.  You need to use pads and tampons at the same time because you are bleeding so much.  You are taking medicine and you: ? Feel sick to your stomach (nauseous). ? Throw up (vomit). ? Have watery poop (diarrhea).  You have other problems that may be related to the medicine you are taking. Get help  right away if:  You soak through more than a pad or tampon in 1 hour.  You pass clots bigger than 1 inch (2.5 cm) wide.  You feel short of breath.  You feel like your heart is beating too fast.  You feel dizzy or you pass out (faint).  You feel very weak or tired. Summary  Menorrhagia is when your menstrual periods are heavy or last longer than normal.  Take over-the-counter and prescription medicines exactly as told by your doctor. This includes iron pills.  Contact a doctor if you soak through more than a pad or tampon in 1 hour or are passing large clots. This information is not intended to replace advice given to you by your health care provider. Make sure you discuss any questions you have with your health care provider. Document Released: 04/04/2008 Document Revised: 10/03/2017 Document Reviewed: 07/17/2016 Elsevier Patient Education  2020 Elsevier Inc.  

## 2019-02-11 ENCOUNTER — Other Ambulatory Visit: Payer: Self-pay

## 2019-02-11 ENCOUNTER — Ambulatory Visit (INDEPENDENT_AMBULATORY_CARE_PROVIDER_SITE_OTHER): Payer: Medicaid Other | Admitting: Obstetrics and Gynecology

## 2019-02-11 ENCOUNTER — Encounter: Payer: Self-pay | Admitting: Obstetrics and Gynecology

## 2019-02-11 VITALS — BP 118/76 | HR 80 | Ht 62.0 in | Wt 168.7 lb

## 2019-02-11 DIAGNOSIS — Z9851 Tubal ligation status: Secondary | ICD-10-CM | POA: Diagnosis not present

## 2019-02-11 DIAGNOSIS — D500 Iron deficiency anemia secondary to blood loss (chronic): Secondary | ICD-10-CM | POA: Diagnosis not present

## 2019-02-11 DIAGNOSIS — N921 Excessive and frequent menstruation with irregular cycle: Secondary | ICD-10-CM | POA: Diagnosis not present

## 2019-02-11 NOTE — Progress Notes (Signed)
GYNECOLOGY PROGRESS NOTE  Subjective:    Patient ID: Melanie Nicholson, female    DOB: [birth year], 43 y.o.   MRN: [id number]  HPI  Patient is a 43 y.o. [id number] female who presents for complaints of prolonged heavy menstrual cycles with clotting for "a long time". She was referred from Doreene BurkeAnnie Thompson, CNM for discussion of surgical management (endometrial ablation).   Patient notes that at least over the past year or 2 she has had cycles lasting for approximately 15-16 days, then goes away for 5-10 days, and returns again. She sometimes only has 1 cycle a month, but sometimes has 2 cycles in a month.  Day 2 is usually the heaviest day, where she has to wear 2 tampons and 1 pad and changes every 2-3 hours.  She reports soaking through her clothes occasionally, and often soaks through her sheets at night. She has a history of anemia requiring iron infusions.     The following portions of the patient's history were reviewed and updated as appropriate:  She  has a past medical history of ADD (attention deficit disorder), Anxiety, Bipolar affective ([facility]), Depression, Gastric bypass status for obesity, Insomnia, Iron deficiency anemia due to chronic blood loss (09/11/2018), Reflux, and Thyroid disease.   She  has a past surgical history that includes Gallbladder surgery; Cesarean section; Ectopic pregnancy surgery; Cholecystectomy; Tonsillectomy; gastic bypass; and Dilation and curettage of uterus.  Her family history includes Diabetes in her paternal grandmother; Heart disease in her father; Mental illness in her mother; Ovarian cancer in her cousin, maternal aunt, maternal aunt, and maternal aunt; Thyroid cancer in her cousin; Thyroid disease in her father.   She  reports that she has been smoking cigarettes. She has never used smokeless tobacco. She reports current alcohol use. She reports current drug use. Drug: Marijuana.   Current Outpatient Medications on File Prior to Visit  Medication Sig  Dispense Refill  . clonazePAM (KLONOPIN) 1 MG tablet Take 1 mg by mouth 2 (two) times daily.    Marland Kitchen. LATUDA 80 MG TABS tablet Take 80 mg by mouth at bedtime.    Marland Kitchen. levothyroxine (SYNTHROID, LEVOTHROID) 200 MCG tablet Take 200 mcg by mouth daily before breakfast.    . lisdexamfetamine (VYVANSE) 50 MG capsule Take 50 mg by mouth daily.    . Multiple Vitamins-Minerals (BARIATRIC MULTIVITAMINS/IRON) CAPS Take 1-2 capsules by mouth daily.     No current facility-administered medications on file prior to visit.    She is allergic to nsaids..  Review of Systems Pertinent items noted in HPI and remainder of comprehensive ROS otherwise negative.   Objective:   Blood pressure 118/76, pulse 80, height 5\' 2"  (1.575 m), weight 168 lb 11.2 oz (76.5 kg), last menstrual period 01/12/2019. General appearance: alert and no distress Abdomen: soft, non-tender; bowel sounds normal; no masses,  no organomegaly Pelvic: deferred Extremities: extremities normal, atraumatic, no cyanosis or edema Neurologic: Grossly normal   Labs:  Lab Results  Component Value Date   TSH 2.005 09/09/2018    Lab Results  Component Value Date   WBC 5.9 11/04/2018   HGB 13.1 11/04/2018   HCT 39.5 11/04/2018   MCV 88.2 11/04/2018   PLT 185 11/04/2018     Assessment:   Menometrorrhagia History of iron deficiency anemia due to chronic blood loss  History of tubal ligation  Plan:   - Patient has abnormal uterine bleeding. Reviewed labs. Pelvic ultrasound to evaluate for any structural gynecologic abnormalities.  Will contact patient  with these results and plans for further evaluation/management.  She will need an endometrial biopsy prior to surgical management. Will perform at next visit.  - Discussed management options for abnormal uterine bleeding including NSAIDs (Naproxen), tranexamic acid (Lysteda), oral progesterone, Depo Provera, Levonogestrel IUD, endometrial ablation or hysterectomy as definitive surgical  management.  Discussed risks and benefits of each method.   Patient notes she has tried Depo Provera and IUD as contraceptive methods in the past and did not like them. Patient desires endometrial ablation.  Printed patient education handouts were given to the patient to review at home.  Can prescribe Lysteda or Aygestin for next cycle if heavy if prior to surgical treatment.  Patient can tentatively be scheduled for 7/21 or 7/24.   A total of 15 minutes were spent face-to-face with the patient during this encounter and over half of that time dealt with counseling and coordination of care.   Rubie Maid, MD [facility]

## 2019-02-11 NOTE — Progress Notes (Signed)
Pt is present to discuss getting an ablation. Pt stated that she doing well no problems.

## 2019-02-11 NOTE — Patient Instructions (Signed)
Abnormal Uterine Bleeding °Abnormal uterine bleeding means bleeding more than usual from your uterus. It can include: °· Bleeding between periods. °· Bleeding after sex. °· Bleeding that is heavier than normal. °· Periods that last longer than usual. °· Bleeding after you have stopped having your period (menopause). °There are many problems that may cause this. You should see a doctor for any kind of bleeding that is not normal. Treatment depends on the cause of the bleeding. °Follow these instructions at home: °· Watch your condition for any changes. °· Do not use tampons, douche, or have sex, if your doctor tells you not to. °· Change your pads often. °· Get regular well-woman exams. Make sure they include a pelvic exam and cervical cancer screening. °· Keep all follow-up visits as told by your doctor. This is important. °Contact a doctor if: °· The bleeding lasts more than one week. °· You feel dizzy at times. °· You feel like you are going to throw up (nauseous). °· You throw up. °Get help right away if: °· You pass out. °· You have to change pads every hour. °· You have belly (abdominal) pain. °· You have a fever. °· You get sweaty. °· You get weak. °· You passing large blood clots from your vagina. °Summary °· Abnormal uterine bleeding means bleeding more than usual from your uterus. °· There are many problems that may cause this. You should see a doctor for any kind of bleeding that is not normal. °· Treatment depends on the cause of the bleeding. °This information is not intended to replace advice given to you by your health care provider. Make sure you discuss any questions you have with your health care provider. °Document Released: 04/23/2009 Document Revised: 06/20/2016 Document Reviewed: 06/20/2016 °Elsevier Patient Education © 2020 Elsevier Inc. ° °

## 2019-02-19 ENCOUNTER — Ambulatory Visit (INDEPENDENT_AMBULATORY_CARE_PROVIDER_SITE_OTHER): Payer: Medicaid Other | Admitting: Obstetrics and Gynecology

## 2019-02-19 ENCOUNTER — Other Ambulatory Visit (HOSPITAL_COMMUNITY)
Admission: RE | Admit: 2019-02-19 | Discharge: 2019-02-19 | Disposition: A | Discharge status: Home / Self Care | Payer: Medicaid Other | Source: Ambulatory Visit | Attending: Obstetrics and Gynecology | Admitting: Obstetrics and Gynecology

## 2019-02-19 ENCOUNTER — Ambulatory Visit (INDEPENDENT_AMBULATORY_CARE_PROVIDER_SITE_OTHER): Payer: Medicaid Other

## 2019-02-19 ENCOUNTER — Other Ambulatory Visit: Payer: Self-pay

## 2019-02-19 ENCOUNTER — Encounter: Payer: Self-pay | Admitting: Obstetrics and Gynecology

## 2019-02-19 VITALS — BP 96/66 | HR 84 | Ht 62.0 in | Wt 167.7 lb

## 2019-02-19 DIAGNOSIS — N939 Abnormal uterine and vaginal bleeding, unspecified: Secondary | ICD-10-CM | POA: Insufficient documentation

## 2019-02-19 DIAGNOSIS — N921 Excessive and frequent menstruation with irregular cycle: Secondary | ICD-10-CM

## 2019-02-19 DIAGNOSIS — Z9851 Tubal ligation status: Secondary | ICD-10-CM

## 2019-02-19 DIAGNOSIS — E039 Hypothyroidism, unspecified: Secondary | ICD-10-CM | POA: Diagnosis not present

## 2019-02-19 DIAGNOSIS — D500 Iron deficiency anemia secondary to blood loss (chronic): Secondary | ICD-10-CM

## 2019-02-19 NOTE — Progress Notes (Signed)
Pt is present for a follow up for prolonged vaginal bleeding. Pt stated that her cycles are very irregular, heavy bleeding with clots.

## 2019-02-19 NOTE — Patient Instructions (Signed)
Preparing for Surgery Preparing for surgery is an important part of your care. It can make things go more smoothly and help you avoid complications. The steps leading up to surgery may vary among hospitals. Follow all instructions given to you by your health care providers. Ask questions if you do not understand something. Talk about any concerns that you have. Here are some questions to consider asking before your surgery:  If my surgery is not an emergency (is elective), when would be the best time to have the surgery?  What arrangements do I need to make for work, home, or school?  What will my recovery be like? How long will it be before I can return to normal activities?  Will I need to prepare my home? Will I need to arrange care for me or my children?  Should I expect to have pain after surgery? What are my pain management options? Are there nonmedical options that I can try for pain? Tell a health care provider about:   Any allergies you have.  All medicines you are taking, including vitamins, herbs, eye drops, creams, and over-the-counter medicines.  Any problems you or family members have had with anesthetic medicines.  Any blood disorders you have.  Any surgeries you have had.  Any medical conditions you have.  Whether you are pregnant or may be pregnant. What are the risks? The risks and complications of surgery depend on the specific procedure that you have. Discuss all the risks with your health care providers before your surgery. Ask about common surgical complications, which may include:  Infection.  Bleeding or a need for blood replacement (transfusion).  Allergic reactions to medicines.  Damage to surrounding nerves, tissues, or structures.  A blood clot.  Scarring.  Failure of the surgery to correct the problem. Follow these instructions before the procedure: Several days or weeks before your procedure  You may have a physical exam by your primary  health care provider to make sure it is safe for you to have surgery.  You may have testing. This may include a chest X-ray, blood and urine tests, electrocardiogram (ECG), or other testing.  Ask your health care provider about: ? Changing or stopping your regular medicines. This is especially important if you are taking diabetes medicines or blood thinners. ? Taking medicines such as aspirin and ibuprofen. These medicines can thin your blood. Do not take these medicines unless your health care provider tells you to take them. ? Taking over-the-counter medicines, vitamins, herbs, and supplements.  Do not use any products that contain nicotine or tobacco, such as cigarettes and e-cigarettes. If you need help quitting, ask your health care provider.  Avoid alcohol, or limit your alcohol intake to no more than 1 drink a day for nonpregnant women and 2 drinks a day for men. One drink equals 12 oz of beer, 5 oz of wine, or 1 oz of hard liquor.  Ask your health care provider if there are exercises you can do to prepare for surgery.  Eat a healthy diet. A healthy diet includes low-fat dairy products, low-fat (lean) meats, and fiber from whole grains, beans, and lots of fruits and vegetables.  Plan to have someone take you home from the hospital or clinic.  Plan to have a responsible adult care for you for at least 24 hours after you leave the hospital or clinic. This is important. The day before your procedure  You may be given antibiotic medicine to take by mouth to   help prevent infection. Take it as told by your health care provider.  You may be asked to shower with a germ-killing soap.  Follow instructions from your health care provider about eating and drinking restrictions. The day of your procedure  You may need to take another shower with a germ-killing soap before you leave home in the morning.  With a small sip of water, take only the medicines that you are told to take.  Do not  wear any makeup, nail polish, powder, deodorant, lotion, jewelry, hair accessories, or anything on your skin or body except your clothes.  If you will be staying in the hospital, bring a case to hold your glasses, contacts, or dentures. You may also want to bring your robe and non-skid footwear.  If instructed by your health care provider, bring your sleep apnea device with you on the day of your surgery (if this applies to you).  Arrive at the hospital as scheduled.  Bring a friend or family member with you who can help to answer questions and be present while you meet with your health care provider. At the hospital  When you arrive at the hospital, you will likely: ? Go to the reception desk to notify staff that you have arrived. ? Move to the preoperative and preparation areas before going to the operating room.  You may have to wear compression stockings. These help to prevent blood clots and reduce swelling in your legs.  An IV may be inserted into one of your veins.  In the operating room, you may be given one or more of the following: ? A medicine to help you relax (sedative). ? A medicine to numb the area (local anesthetic). ? A medicine to make you fall asleep (general anesthetic). ? A medicine that is injected into an area of your body to numb everything below the injection site (regional anesthetic).  Your surgical site will be marked or identified.  You may be given an antibiotic through your IV to help prevent infection. Contact a health care provider if you:  Develop a fever of more than 100.4F (38C) or other feelings of illness during the 48 hours before your surgery.  Have symptoms that get worse.  Have questions or concerns about your surgery. Summary  Preparing for surgery can make the procedure go more smoothly and lower your risk of complications.  Before surgery, make a list of questions and concerns to discuss with your surgeon. Ask about the risks and  possible complications.  In the days or weeks before your surgery, follow all instructions from your health care provider. You may need to stop smoking, avoid alcohol, follow eating restrictions, and change or stop your regular medicines.  Contact your surgeon if you develop a fever or other signs of illness during the few days before your surgery. This information is not intended to replace advice given to you by your health care provider. Make sure you discuss any questions you have with your health care provider. Document Released: 05/01/2017 Document Revised: 06/29/2017 Document Reviewed: 05/01/2017 Elsevier Patient Education  2020 [employer] Inc.  

## 2019-02-21 NOTE — Progress Notes (Signed)
Please inform patient of normal biopsy results.

## 2019-02-22 ENCOUNTER — Encounter: Payer: Self-pay | Admitting: Obstetrics and Gynecology

## 2019-02-22 NOTE — H&P (View-Only) (Signed)
  Preoperative History and Physical  Melanie Nicholson is a 42 y.o. [id number] here for surgical management of menometrorrhagia and history of anemia.   No significant preoperative concerns.  Patient notes that at least over the past year or 2 she has had cycles lasting for approximately 15-16 days, then goes away for 5-10 days, and returns again. She sometimes only has 1 cycle a month, but sometimes has 2 cycles in a month.  Day 2 is usually the heaviest day, where she has to wear 2 tampons and 1 pad and changes every 2-3 hours.  She reports soaking through her clothes occasionally, and often soaks through her sheets at night. She has a history of anemia requiring iron infusions.     Proposed surgery: Hysteroscopy Dilation and Curettage, Endometrial ablation (with Minerva)    Past Medical History:  Diagnosis Date  . ADD (attention deficit disorder)   . Anxiety   . Bipolar affective (HCC)   . Depression   . Gastric bypass status for obesity   . Insomnia   . Iron deficiency anemia due to chronic blood loss 09/11/2018  . Reflux   . Thyroid disease    Past Surgical History:  Procedure Laterality Date  . CESAREAN SECTION     x4  . CHOLECYSTECTOMY    . DILATION AND CURETTAGE OF UTERUS    . ECTOPIC PREGNANCY SURGERY    . GALLBLADDER SURGERY    . gastic bypass     05/2014  . TONSILLECTOMY     OB History  Gravida Para Term Preterm AB Living  8 5 5 0 3 5  SAB TAB Ectopic Multiple Live Births  2 0 1   5    # Outcome Date GA Lbr Len/2nd Weight Sex Delivery Anes PTL Lv  8 Term 11/13/16   8 lb 14 oz (4.026 kg) M CS-Unspec  N LIV  7 SAB 04/2010 [redacted]w[redacted]d         6 Ectopic 02/21/09          5 Term 02/12/07 [redacted]w[redacted]d  8 lb 9 oz (3.884 kg) F CS-Unspec   LIV  4 Term 12/10/00 [redacted]w[redacted]d  8 lb 1 oz (3.657 kg) F CS-Unspec   LIV  3 Term 12/31/98 [redacted]w[redacted]d  8 lb 5 oz (3.771 kg) M CS-Unspec   LIV  2 Term 04/24/97 [redacted]w[redacted]d  9 lb 9 oz (4.338 kg) F CS-Unspec  N LIV  1 SAB 1995            Obstetric Comments   Laparotomy for ruptured ectopic with salpingectomy  Sab in 04/2010 followed by D & C    Family History  Problem Relation Age of Onset  . Mental illness Mother   . Heart disease Father   . Thyroid disease Father   . Diabetes Paternal Grandmother   . Ovarian cancer Maternal Aunt   . Ovarian cancer Cousin   . Thyroid cancer Cousin   . Ovarian cancer Maternal Aunt   . Ovarian cancer Maternal Aunt   . Breast cancer Neg Hx   . Colon cancer Neg Hx     Social History   Socioeconomic History  . Marital status: Legally Separated    Spouse name: Not on file  . Number of children: Not on file  . Years of education: Not on file  . Highest education level: Not on file  Occupational History  . Not on file  Social Needs  . Financial resource strain: Not on file  .   Food insecurity    Worry: Not on file    Inability: Not on file  . Transportation needs    Medical: Not on file    Non-medical: Not on file  Tobacco Use  . Smoking status: Current Every Day Smoker    Types: Cigarettes  . Smokeless tobacco: Never Used  . Tobacco comment: 8 cigarretes a day  Substance and Sexual Activity  . Alcohol use: Yes    Alcohol/week: 0.0 standard drinks    Comment: occas  . Drug use: Yes    Types: Marijuana    Comment: "every now and then"  . Sexual activity: Yes    Birth control/protection: None    Comment: tubal  Lifestyle  . Physical activity    Days per week: Not on file    Minutes per session: Not on file  . Stress: Not on file  Relationships  . Social Musicianconnections    Talks on phone: Not on file    Gets together: Not on file    Attends religious service: Not on file    Active member of club or organization: Not on file    Attends meetings of clubs or organizations: Not on file    Relationship status: Not on file  . Intimate partner violence    Fear of current or ex partner: Not on file    Emotionally abused: Not on file    Physically abused: Not on file    Forced sexual  activity: Not on file  Other Topics Concern  . Not on file  Social History Narrative   ** Merged History Encounter **         Current Outpatient Medications on File Prior to Visit  Medication Sig Dispense Refill  . clonazePAM (KLONOPIN) 1 MG tablet Take 1 mg by mouth 2 (two) times daily.    Marland Kitchen. LATUDA 80 MG TABS tablet Take 80 mg by mouth at bedtime.    Marland Kitchen. levothyroxine (SYNTHROID, LEVOTHROID) 200 MCG tablet Take 200 mcg by mouth daily before breakfast.    . lisdexamfetamine (VYVANSE) 60 MG capsule Take 60 mg by mouth every morning.    . Multiple Vitamins-Minerals (BARIATRIC MULTIVITAMINS/IRON) CAPS Take 1-2 capsules by mouth daily.     No current facility-administered medications on file prior to visit.    Allergies  Allergen Reactions  . Nsaids Other (See Comments)    History of gastric bypass    Social History:   reports that she has been smoking cigarettes. She has never used smokeless tobacco. She reports current alcohol use. She reports current drug use. Drug: Marijuana.    Review of Systems: Pertinent items noted in HPI and remainder of comprehensive ROS otherwise negative.  PHYSICAL EXAM: Blood pressure 96/66, pulse 84, height 5\' 2"  (1.575 m), weight 167 lb 11.2 oz (76.1 kg), last menstrual period 02/13/2019. CONSTITUTIONAL: Well-developed, well-nourished female in no acute distress.  HENT:  Normocephalic, atraumatic, External right and left ear normal. Oropharynx is clear and moist EYES: Conjunctivae and EOM are normal. Pupils are equal, round, and reactive to light. No scleral icterus.  NECK: Normal range of motion, supple, no masses SKIN: Skin is warm and dry. No rash noted. Not diaphoretic. No erythema. No pallor. NEUROLOGIC: Alert and oriented to person, place, and time. Normal reflexes, muscle tone coordination. No cranial nerve deficit noted. PSYCHIATRIC: Normal mood and affect. Normal behavior. Normal judgment and thought content. CARDIOVASCULAR: Normal heart rate  noted, regular rhythm RESPIRATORY: Effort and breath sounds normal, no problems with respiration  noted ABDOMEN: Soft, nontender, nondistended. PELVIC: Deferred MUSCULOSKELETAL: Normal range of motion. No edema and no tenderness. 2+ distal pulses.  Labs: 09/05/2018 - Pap smear normal.  02/19/2019 0 Endometrial biopsy with benign endometrium.  Imaging Studies: US Pelvis (transabdominal Only)  Result Date: 02/19/2019 Patient Name: Melanie Nicholson DOB: [birth year] MRN: [id number] ULTRASOUND REPORT Location: [facility] Date of Service: 02/19/2019 Indications:Pelvic Pain Findings: The uterus is anteverted and measures 10. X 4.0 x 5.5 cm.. Echo texture is homogenous without evidence of focal masses. The Endometrium measures 4 mm. Right Ovary measures 3.0 x 2.0 x 2.5  cm. It is normal in appearance. Left Ovary measures 1.2 x 0.7 x 0.6 cm. It is normal in appearance. Survey of the adnexa demonstrates no adnexal masses. There is no free fluid in the cul de sac. Impression: 1. Pelvic ultrasound appears to be WNL. Recommendations: 1.Clinical correlation with the patient's History and Physical Exam. Jenine M. Albertine Grates    RDMS I have reviewed this study and agree with documented findings. Rubie Maid, MD [facility]   Assessment: Menometrorrhagia  Iron deficiency anemia due to chronic blood loss History of tubal ligation  Acquired hypothyroidism  Plan: Patient will undergo surgical management with Hysteroscopy D&C with endometrial ablation.   The risks of surgery were discussed in detail with the patient including but not limited to: bleeding which may require transfusion or reoperation; infection which may require antibiotics; injury to surrounding organs which may involve bowel, bladder; need for additional procedures including laparoscopy or laparotomy; thromboembolic phenomenon, surgical site problems and other postoperative/anesthesia complications. Likelihood of success in alleviating  the patient's condition was discussed. Routine postoperative instructions will be reviewed with the patient and her family in detail after surgery.  The patient concurred with the proposed plan, giving informed written consent for the surgery.  Patient has been NPO since midnight.     Rubie Maid, MD [facility]

## 2019-02-22 NOTE — Progress Notes (Signed)
GYNECOLOGY PROGRESS NOTE  Subjective:    Patient ID: Melanie Nicholson, female    DOB: [birth year], 43 y.o.   MRN: [id number]  HPI  Patient is a 43 y.o. [id number] female who presents for endometrial biopsy, get results of ultrasound, and for pre-operative examination for desired surgical intervention with endometrial ablation for menometrorrhagia, history of iron deficiency anemia.   The following portions of the patient's history were reviewed and updated as appropriate: allergies, current medications, past family history, past medical history, past social history, past surgical history and problem list.  Review of Systems Pertinent items noted in HPI and remainder of comprehensive ROS otherwise negative.   Objective:   Blood pressure 96/66, pulse 84, height 5\' 2"  (1.575 m), weight 167 lb 11.2 oz (76.1 kg), last menstrual period 02/13/2019. General appearance: alert and no distress Abdomen: soft, non-tender; bowel sounds normal; no masses,  no organomegaly Pelvic: external genitalia normal, rectovaginal septum normal.  Vagina without discharge, small amount of dark red blood in vaginal vault.  Cervix normal appearing, no lesions and no motion tenderness.  Uterus mobile, nontender, normal shape and size.  Adnexae non-palpable, nontender bilaterally.  Extremities: extremities normal, atraumatic, no cyanosis or edema Neurologic: Grossly normal   Labs: 09/03/2018 - Pap smear normal, negative HPV  Imaging:  US PELVIS (TRANSABDOMINAL ONLY) Patient Name: Melanie Nicholson DOB: [birth year] MRN: [id number]  ULTRASOUND REPORT  Location: [facility] Date of Service: 02/19/2019   Indications:Pelvic Pain Findings:  The uterus is anteverted and measures 10. X 4.0 x 5.5 cm.. Echo texture is homogenous without evidence of focal masses.  The Endometrium measures 4 mm.  Right Ovary measures 3.0 x 2.0 x 2.5  cm. It is normal in appearance. Left Ovary measures 1.2 x 0.7 x 0.6 cm. It is  normal in appearance. Survey of the adnexa demonstrates no adnexal masses. There is no free fluid in the cul de sac.  Impression: 1. Pelvic ultrasound appears to be WNL.  Recommendations: 1.Clinical correlation with the patient's History and Physical Exam.  Jenine M. Marciano SequinAlessi    RDMS  I have reviewed this study and agree with documented findings.   Hildred LaserAnika Raymundo Rout, MD [facility]    Assessment:   Menometrorrhagia Iron deficiency anemia due to chronic blood loss  History of tubal ligation  Plan:   1. Ultrasound results reviewed with patient.  No structural causes of abnormal bleeding noted. Discussed need for final workup with endometrial ablation with endometrial biopsy.  See procedure note below.  2. Patient desires surgical management with endometrial ablation.  The risks of surgery were discussed in detail with the patient including but not limited to: bleeding which may require transfusion or reoperation; infection which may require prolonged hospitalization or re-hospitalization and antibiotic therapy; injury to bowel, bladder,  need for additional procedures including laparotomy; thromboembolic phenomenon, and other postoperative or anesthesia complications.  She was also informed about the possibility of post ligation-ablation syndrome. Patient was told that the likelihood that her condition and symptoms will be treated effectively with this surgical management was very high; the postoperative expectations were also discussed in detail. The patient also understands the alternative treatment options which were discussed in full. All questions were answered.  She was told that she will be contacted by our surgical scheduler regarding the time and date of her surgery; routine preoperative instructions will be given to her by the preoperative nursing team.   She is aware of need for preoperative COVID testing and  subsequent quarantine from time of test to time of surgery; she  will be given further preoperative instructions at that [place] screening visit. Printed patient education handouts about the procedure were given to the patient to review at home.  Patient to be scheduled for Hysteroscopy D&C with endometrial ablation (MInerva) on 02/28/2019.    Endometrial Biopsy Procedure Note  The patient is positioned on the exam table in the dorsal lithotomy position. Bimanual exam confirms uterine position and size. A Graves speculum is placed into the vagina. A single toothed tenaculum is placed onto the anterior lip of the cervix. The pipette is placed into the endocervical canal and is advanced to the uterine fundus. Using a piston like technique, with vacuum created by withdrawing the stylus, the endometrial specimen is obtained and transferred to the biopsy container. Minimal bleeding is encountered. The procedure is well tolerated.   Uterine Position: anterior    Uterine Length: 9 cm   Uterine Specimen: Average    Post procedure instructions are given. The patient is scheduled for follow up appointment.     Rubie Maid, MD [facility]

## 2019-02-22 NOTE — H&P (Signed)
Preoperative History and Physical  Melanie Nicholson is a 43 y.o. [id number] here for surgical management of menometrorrhagia and history of anemia.   No significant preoperative concerns.  Patient notes that at least over the past year or 2 she has had cycles lasting for approximately 15-16 days, then goes away for 5-10 days, and returns again. She sometimes only has 1 cycle a month, but sometimes has 2 cycles in a month.  Day 2 is usually the heaviest day, where she has to wear 2 tampons and 1 pad and changes every 2-3 hours.  She reports soaking through her clothes occasionally, and often soaks through her sheets at night. She has a history of anemia requiring iron infusions.     Proposed surgery: Hysteroscopy Dilation and Curettage, Endometrial ablation (with Minerva)    Past Medical History:  Diagnosis Date  . ADD (attention deficit disorder)   . Anxiety   . Bipolar affective (HCC)   . Depression   . Gastric bypass status for obesity   . Insomnia   . Iron deficiency anemia due to chronic blood loss 09/11/2018  . Reflux   . Thyroid disease    Past Surgical History:  Procedure Laterality Date  . CESAREAN SECTION     x4  . CHOLECYSTECTOMY    . DILATION AND CURETTAGE OF UTERUS    . ECTOPIC PREGNANCY SURGERY    . GALLBLADDER SURGERY    . gastic bypass     05/2014  . TONSILLECTOMY     OB History  Gravida Para Term Preterm AB Living  8 5 5  0 3 5  SAB TAB Ectopic Multiple Live Births  2 0 1   5    # Outcome Date GA Lbr Len/2nd Weight Sex Delivery Anes PTL Lv  8 Term 11/13/16   8 lb 14 oz (4.026 kg) M CS-Unspec  N LIV  7 SAB 04/2010 6264w0d         6 Ectopic 02/21/09          5 Term 02/12/07 8147w0d  8 lb 9 oz (3.884 kg) F CS-Unspec   LIV  4 Term 12/10/00 4747w0d  8 lb 1 oz (3.657 kg) F CS-Unspec   LIV  3 Term 12/31/98 4847w0d  8 lb 5 oz (3.771 kg) M CS-Unspec   LIV  2 Term 04/24/97 7189w0d  9 lb 9 oz (4.338 kg) F CS-Unspec  N LIV  1 SAB 1995            Obstetric Comments   Laparotomy for ruptured ectopic with salpingectomy  Sab in 04/2010 followed by D & C    Family History  Problem Relation Age of Onset  . Mental illness Mother   . Heart disease Father   . Thyroid disease Father   . Diabetes Paternal Grandmother   . Ovarian cancer Maternal Aunt   . Ovarian cancer Cousin   . Thyroid cancer Cousin   . Ovarian cancer Maternal Aunt   . Ovarian cancer Maternal Aunt   . Breast cancer Neg Hx   . Colon cancer Neg Hx     Social History   Socioeconomic History  . Marital status: Legally Separated    Spouse name: Not on file  . Number of children: Not on file  . Years of education: Not on file  . Highest education level: Not on file  Occupational History  . Not on file  Social Needs  . Financial resource strain: Not on file  .  Food insecurity    Worry: Not on file    Inability: Not on file  . Transportation needs    Medical: Not on file    Non-medical: Not on file  Tobacco Use  . Smoking status: Current Every Day Smoker    Types: Cigarettes  . Smokeless tobacco: Never Used  . Tobacco comment: 8 cigarretes a day  Substance and Sexual Activity  . Alcohol use: Yes    Alcohol/week: 0.0 standard drinks    Comment: occas  . Drug use: Yes    Types: Marijuana    Comment: "every now and then"  . Sexual activity: Yes    Birth control/protection: None    Comment: tubal  Lifestyle  . Physical activity    Days per week: Not on file    Minutes per session: Not on file  . Stress: Not on file  Relationships  . Social Musicianconnections    Talks on phone: Not on file    Gets together: Not on file    Attends religious service: Not on file    Active member of club or organization: Not on file    Attends meetings of clubs or organizations: Not on file    Relationship status: Not on file  . Intimate partner violence    Fear of current or ex partner: Not on file    Emotionally abused: Not on file    Physically abused: Not on file    Forced sexual  activity: Not on file  Other Topics Concern  . Not on file  Social History Narrative   ** Merged History Encounter **         Current Outpatient Medications on File Prior to Visit  Medication Sig Dispense Refill  . clonazePAM (KLONOPIN) 1 MG tablet Take 1 mg by mouth 2 (two) times daily.    Marland Kitchen. LATUDA 80 MG TABS tablet Take 80 mg by mouth at bedtime.    Marland Kitchen. levothyroxine (SYNTHROID, LEVOTHROID) 200 MCG tablet Take 200 mcg by mouth daily before breakfast.    . lisdexamfetamine (VYVANSE) 60 MG capsule Take 60 mg by mouth every morning.    . Multiple Vitamins-Minerals (BARIATRIC MULTIVITAMINS/IRON) CAPS Take 1-2 capsules by mouth daily.     No current facility-administered medications on file prior to visit.    Allergies  Allergen Reactions  . Nsaids Other (See Comments)    History of gastric bypass    Social History:   reports that she has been smoking cigarettes. She has never used smokeless tobacco. She reports current alcohol use. She reports current drug use. Drug: Marijuana.    Review of Systems: Pertinent items noted in HPI and remainder of comprehensive ROS otherwise negative.  PHYSICAL EXAM: Blood pressure 96/66, pulse 84, height 5\' 2"  (1.575 m), weight 167 lb 11.2 oz (76.1 kg), last menstrual period 02/13/2019. CONSTITUTIONAL: Well-developed, well-nourished female in no acute distress.  HENT:  Normocephalic, atraumatic, External right and left ear normal. Oropharynx is clear and moist EYES: Conjunctivae and EOM are normal. Pupils are equal, round, and reactive to light. No scleral icterus.  NECK: Normal range of motion, supple, no masses SKIN: Skin is warm and dry. No rash noted. Not diaphoretic. No erythema. No pallor. NEUROLOGIC: Alert and oriented to person, place, and time. Normal reflexes, muscle tone coordination. No cranial nerve deficit noted. PSYCHIATRIC: Normal mood and affect. Normal behavior. Normal judgment and thought content. CARDIOVASCULAR: Normal heart rate  noted, regular rhythm RESPIRATORY: Effort and breath sounds normal, no problems with respiration  noted ABDOMEN: Soft, nontender, nondistended. PELVIC: Deferred MUSCULOSKELETAL: Normal range of motion. No edema and no tenderness. 2+ distal pulses.  Labs: 09/05/2018 - Pap smear normal.  02/19/2019 0 Endometrial biopsy with benign endometrium.  Imaging Studies: US Pelvis (transabdominal Only)  Result Date: 02/19/2019 Patient Name: CECILLE MCCLUSKY DOB: [birth year] MRN: [id number] ULTRASOUND REPORT Location: [facility] Date of Service: 02/19/2019 Indications:Pelvic Pain Findings: The uterus is anteverted and measures 10. X 4.0 x 5.5 cm.. Echo texture is homogenous without evidence of focal masses. The Endometrium measures 4 mm. Right Ovary measures 3.0 x 2.0 x 2.5  cm. It is normal in appearance. Left Ovary measures 1.2 x 0.7 x 0.6 cm. It is normal in appearance. Survey of the adnexa demonstrates no adnexal masses. There is no free fluid in the cul de sac. Impression: 1. Pelvic ultrasound appears to be WNL. Recommendations: 1.Clinical correlation with the patient's History and Physical Exam. Jenine M. Albertine Grates    RDMS I have reviewed this study and agree with documented findings. Rubie Maid, MD [facility]   Assessment: Menometrorrhagia  Iron deficiency anemia due to chronic blood loss History of tubal ligation  Acquired hypothyroidism  Plan: Patient will undergo surgical management with Hysteroscopy D&C with endometrial ablation.   The risks of surgery were discussed in detail with the patient including but not limited to: bleeding which may require transfusion or reoperation; infection which may require antibiotics; injury to surrounding organs which may involve bowel, bladder; need for additional procedures including laparoscopy or laparotomy; thromboembolic phenomenon, surgical site problems and other postoperative/anesthesia complications. Likelihood of success in alleviating  the patient's condition was discussed. Routine postoperative instructions will be reviewed with the patient and her family in detail after surgery.  The patient concurred with the proposed plan, giving informed written consent for the surgery.  Patient has been NPO since midnight.     Rubie Maid, MD [facility]

## 2019-02-25 ENCOUNTER — Other Ambulatory Visit: Payer: Self-pay

## 2019-02-25 ENCOUNTER — Other Ambulatory Visit
Admission: RE | Admit: 2019-02-25 | Discharge: 2019-02-25 | Disposition: A | Discharge status: Home / Self Care | Payer: Medicaid Other | Source: Ambulatory Visit | Attending: Obstetrics and Gynecology | Admitting: Obstetrics and Gynecology

## 2019-02-25 DIAGNOSIS — D500 Iron deficiency anemia secondary to blood loss (chronic): Secondary | ICD-10-CM | POA: Insufficient documentation

## 2019-02-25 DIAGNOSIS — N921 Excessive and frequent menstruation with irregular cycle: Secondary | ICD-10-CM | POA: Insufficient documentation

## 2019-02-25 DIAGNOSIS — Z20828 Contact with and (suspected) exposure to other viral communicable diseases: Secondary | ICD-10-CM | POA: Diagnosis not present

## 2019-02-25 DIAGNOSIS — Z01812 Encounter for preprocedural laboratory examination: Secondary | ICD-10-CM | POA: Insufficient documentation

## 2019-02-25 LAB — SARS CORONAVIRUS 2 (TAT 6-24 HRS): SARS Coronavirus 2: NEGATIVE

## 2019-02-26 ENCOUNTER — Other Ambulatory Visit: Payer: Self-pay

## 2019-02-26 ENCOUNTER — Encounter
Admission: RE | Admit: 2019-02-26 | Discharge: 2019-02-26 | Disposition: A | Discharge status: Home / Self Care | Payer: Medicaid Other | Source: Ambulatory Visit | Attending: Obstetrics and Gynecology | Admitting: Obstetrics and Gynecology

## 2019-02-26 HISTORY — DX: Unspecified asthma, uncomplicated: J45.909

## 2019-02-26 HISTORY — DX: Sleep apnea, unspecified: G47.30

## 2019-02-26 HISTORY — DX: Hypothyroidism, unspecified: E03.9

## 2019-02-26 HISTORY — DX: Chronic obstructive pulmonary disease, unspecified: J44.9

## 2019-02-26 HISTORY — DX: Prediabetes: R73.03

## 2019-02-26 NOTE — Patient Instructions (Signed)
  Your procedure is scheduled on: Friday February 28, 2019 Report to Same Day Surgery 2nd floor [facility] Entrance-take elevator on left to 2nd floor.  Check in with surgery information desk.) To find out your arrival time, call [phone] 1:00-3:00 PM on Thursday February 27, 2019  Remember: Instructions that are not followed completely may result in serious medical risk, up to and including death, or upon the discretion of your surgeon and anesthesiologist your surgery may need to be rescheduled.    __x__ 1. Do not eat food (including mints, candies, chewing gum) after midnight the night before your procedure. You may drink clear liquids up to 2 hours before you are scheduled to arrive at the hospital for your procedure.  Do not drink anything within 2 hours of your scheduled arrival to the hospital.  Approved clear liquids:  --Water    __x__ 2. No Alcohol for 24 hours before or after surgery.   __x__ 3. No Smoking or e-cigarettes for 24 hours before surgery.  Do not use any chewable tobacco products for at least 6 hours before surgery.   __x__ 4. Notify your doctor if there is any change in your medical condition (cold, fever, infections).   __x__ 5. On the morning of surgery brush your teeth with toothpaste and water.  You may rinse your mouth with mouthwash if you wish.  Do not swallow any toothpaste or mouthwash.  Please read over the following fact sheets that you were given:   [facility] Preparing for Surgery and/or MRSA Information    __x__ Use antibacterial soap such as Dial to shower/bathe on the day of surgery.   Do not wear jewelry, make-up, hairpins, clips or nail polish on the day of surgery.  Do not wear lotions, powders, deodorant, or perfumes.   Do not shave below the face/neck 48 hours prior to surgery.   Do not bring valuables to the hospital.    [facility] is not responsible for any belongings or valuables.               Contacts, dentures or  bridgework may not be worn into surgery.  For patients discharged on the day of surgery, you will NOT be permitted to drive yourself home.  You must have a responsible adult with you for 24 hours after surgery.  __x__ Take these medicines on the morning of surgery with a SMALL SIP OF WATER:  1. Levothyroxine  __x__ Follow recommendations from Cardiologist, Pulmonologist or PCP regarding stopping Aspirin, Coumadin, Plavix, Eliquis, Effient, Pradaxa, and Pletal.  __x__ STARTING TODAY: Do not take any Anti-inflammatories such as Advil, Ibuprofen, Motrin, Aleve, Naproxen, Naprosyn, BC/Goodies powders or aspirin products. You may continue to take Tylenol and Celebrex.   __x__ STARTING TODAY: Do not take any supplements until after surgery. You may continue to take your multivitamin.

## 2019-02-28 ENCOUNTER — Other Ambulatory Visit: Payer: Self-pay | Admitting: Obstetrics and Gynecology

## 2019-02-28 ENCOUNTER — Ambulatory Visit
Admission: RE | Admit: 2019-02-28 | Discharge: 2019-02-28 | Disposition: A | Discharge status: Home / Self Care | Payer: Medicaid Other | Attending: Obstetrics and Gynecology | Admitting: Obstetrics and Gynecology

## 2019-02-28 ENCOUNTER — Ambulatory Visit: Payer: Medicaid Other | Admitting: Anesthesiology

## 2019-02-28 ENCOUNTER — Encounter: Payer: Self-pay | Admitting: *Deleted

## 2019-02-28 ENCOUNTER — Encounter
Admission: RE | Disposition: A | Discharge status: Home / Self Care | Payer: Self-pay | Source: Home / Self Care | Attending: Obstetrics and Gynecology

## 2019-02-28 DIAGNOSIS — F419 Anxiety disorder, unspecified: Secondary | ICD-10-CM | POA: Diagnosis not present

## 2019-02-28 DIAGNOSIS — F988 Other specified behavioral and emotional disorders with onset usually occurring in childhood and adolescence: Secondary | ICD-10-CM | POA: Insufficient documentation

## 2019-02-28 DIAGNOSIS — J449 Chronic obstructive pulmonary disease, unspecified: Secondary | ICD-10-CM | POA: Insufficient documentation

## 2019-02-28 DIAGNOSIS — E669 Obesity, unspecified: Secondary | ICD-10-CM | POA: Insufficient documentation

## 2019-02-28 DIAGNOSIS — Z862 Personal history of diseases of the blood and blood-forming organs and certain disorders involving the immune mechanism: Secondary | ICD-10-CM | POA: Insufficient documentation

## 2019-02-28 DIAGNOSIS — D500 Iron deficiency anemia secondary to blood loss (chronic): Secondary | ICD-10-CM | POA: Diagnosis not present

## 2019-02-28 DIAGNOSIS — G4730 Sleep apnea, unspecified: Secondary | ICD-10-CM | POA: Diagnosis not present

## 2019-02-28 DIAGNOSIS — Z9851 Tubal ligation status: Secondary | ICD-10-CM | POA: Diagnosis not present

## 2019-02-28 DIAGNOSIS — E039 Hypothyroidism, unspecified: Secondary | ICD-10-CM | POA: Diagnosis not present

## 2019-02-28 DIAGNOSIS — Z9884 Bariatric surgery status: Secondary | ICD-10-CM | POA: Insufficient documentation

## 2019-02-28 DIAGNOSIS — F17210 Nicotine dependence, cigarettes, uncomplicated: Secondary | ICD-10-CM | POA: Diagnosis not present

## 2019-02-28 DIAGNOSIS — Z6829 Body mass index (BMI) 29.0-29.9, adult: Secondary | ICD-10-CM | POA: Diagnosis not present

## 2019-02-28 DIAGNOSIS — Z79899 Other long term (current) drug therapy: Secondary | ICD-10-CM | POA: Insufficient documentation

## 2019-02-28 DIAGNOSIS — F319 Bipolar disorder, unspecified: Secondary | ICD-10-CM | POA: Diagnosis not present

## 2019-02-28 DIAGNOSIS — N921 Excessive and frequent menstruation with irregular cycle: Secondary | ICD-10-CM

## 2019-02-28 DIAGNOSIS — Z79890 Hormone replacement therapy (postmenopausal): Secondary | ICD-10-CM | POA: Insufficient documentation

## 2019-02-28 HISTORY — PX: HYSTEROSCOPY WITH D & C: SHX1775

## 2019-02-28 LAB — GLUCOSE, CAPILLARY
Glucose-Capillary: 64 mg/dL — ABNORMAL LOW (ref 70–99)
Glucose-Capillary: 103 mg/dL — ABNORMAL HIGH (ref 70–99)
Glucose-Capillary: 45 mg/dL — ABNORMAL LOW (ref 70–99)
Glucose-Capillary: 50 mg/dL — ABNORMAL LOW (ref 70–99)
Glucose-Capillary: 77 mg/dL (ref 70–99)

## 2019-02-28 LAB — URINE DRUG SCREEN, QUALITATIVE (ARMC ONLY)
Amphetamines, Ur Screen: POSITIVE — AB
Barbiturates, Ur Screen: NOT DETECTED
Benzodiazepine, Ur Scrn: POSITIVE — AB
Cannabinoid 50 Ng, Ur ~~LOC~~: POSITIVE — AB
Cocaine Metabolite,Ur ~~LOC~~: NOT DETECTED
MDMA (Ecstasy)Ur Screen: NOT DETECTED
Methadone Scn, Ur: NOT DETECTED
Opiate, Ur Screen: NOT DETECTED
Phencyclidine (PCP) Ur S: NOT DETECTED
Tricyclic, Ur Screen: NOT DETECTED

## 2019-02-28 LAB — TYPE AND SCREEN
ABO/RH(D): A POS
Antibody Screen: NEGATIVE

## 2019-02-28 LAB — CBC
HCT: 37.6 % (ref 36.0–46.0)
Hemoglobin: 12.4 g/dL (ref 12.0–15.0)
MCH: 30.8 pg (ref 26.0–34.0)
MCHC: 33 g/dL (ref 30.0–36.0)
MCV: 93.3 fL (ref 80.0–100.0)
Platelets: 153 10*3/uL (ref 150–400)
RBC: 4.03 MIL/uL (ref 3.87–5.11)
RDW: 12.1 % (ref 11.5–15.5)
WBC: 3.7 10*3/uL — ABNORMAL LOW (ref 4.0–10.5)
nRBC: 0 % (ref 0.0–0.2)

## 2019-02-28 LAB — POCT PREGNANCY, URINE: Preg Test, Ur: NEGATIVE

## 2019-02-28 SURGERY — DILATATION AND CURETTAGE /HYSTEROSCOPY
Anesthesia: General

## 2019-02-28 MED ORDER — PROPOFOL 10 MG/ML IV BOLUS
INTRAVENOUS | Status: DC | PRN
  Administered 2019-02-28: 150 mg via INTRAVENOUS

## 2019-02-28 MED ORDER — OXYCODONE-ACETAMINOPHEN 5-325 MG PO TABS: 1.0000 | ORAL_TABLET | Freq: Four times a day (QID) | ORAL | 0 refills | Status: DC | PRN

## 2019-02-28 MED ORDER — SODIUM CHLORIDE 0.9 % IV SOLN
INTRAVENOUS | Status: DC
  Administered 2019-02-28: 12:00:00 via INTRAVENOUS

## 2019-02-28 MED ORDER — TRAMADOL HCL 50 MG PO TABS: 50.0000 mg | ORAL_TABLET | Freq: Four times a day (QID) | ORAL | 0 refills | Status: DC | PRN

## 2019-02-28 MED ORDER — ACETAMINOPHEN 500 MG PO TABS
ORAL_TABLET | ORAL | Status: AC
  Filled 2019-02-28: qty 2

## 2019-02-28 MED ORDER — PROPOFOL 10 MG/ML IV BOLUS
INTRAVENOUS | Status: AC
  Filled 2019-02-28: qty 20

## 2019-02-28 MED ORDER — LIDOCAINE HCL (CARDIAC) PF 100 MG/5ML IV SOSY
PREFILLED_SYRINGE | INTRAVENOUS | Status: DC | PRN
  Administered 2019-02-28: 100 mg via INTRAVENOUS

## 2019-02-28 MED ORDER — DEXTROSE-NACL 5-0.45 % IV SOLN
INTRAVENOUS | Status: DC
  Administered 2019-02-28: 12:00:00 via INTRAVENOUS

## 2019-02-28 MED ORDER — GLYCOPYRROLATE 0.2 MG/ML IJ SOLN
INTRAMUSCULAR | Status: DC | PRN
  Administered 2019-02-28: 0.2 mg via INTRAVENOUS

## 2019-02-28 MED ORDER — EPHEDRINE SULFATE 50 MG/ML IJ SOLN
INTRAMUSCULAR | Status: DC | PRN
  Administered 2019-02-28: 10 mg via INTRAVENOUS
  Administered 2019-02-28: 5 mg via INTRAVENOUS

## 2019-02-28 MED ORDER — LIDOCAINE HCL (PF) 2 % IJ SOLN
INTRAMUSCULAR | Status: AC
  Filled 2019-02-28: qty 10

## 2019-02-28 MED ORDER — ACETAMINOPHEN 500 MG PO TABS
1000.0000 mg | ORAL_TABLET | Freq: Once | ORAL | Status: AC
  Administered 2019-02-28: 1000 mg via ORAL

## 2019-02-28 MED ORDER — MIDAZOLAM HCL 2 MG/2ML IJ SOLN
INTRAMUSCULAR | Status: DC | PRN
  Administered 2019-02-28: 2 mg via INTRAVENOUS

## 2019-02-28 MED ORDER — ONDANSETRON HCL 4 MG/2ML IJ SOLN: 4.0000 mg | Freq: Once | INTRAMUSCULAR | Status: DC | PRN

## 2019-02-28 MED ORDER — TRAMADOL HCL 50 MG PO TABS
50.0000 mg | ORAL_TABLET | Freq: Once | ORAL | Status: AC
  Administered 2019-02-28: 14:00:00 50 mg via ORAL

## 2019-02-28 MED ORDER — DEXTROSE-NACL 5-0.45 % IV SOLN
Freq: Once | INTRAVENOUS | Status: AC
  Administered 2019-02-28: 11:00:00 via INTRAVENOUS

## 2019-02-28 MED ORDER — TRAMADOL HCL 50 MG PO TABS
ORAL_TABLET | ORAL | Status: AC
  Filled 2019-02-28: qty 1

## 2019-02-28 MED ORDER — FENTANYL CITRATE (PF) 100 MCG/2ML IJ SOLN
INTRAMUSCULAR | Status: AC
  Filled 2019-02-28: qty 2

## 2019-02-28 MED ORDER — FENTANYL CITRATE (PF) 100 MCG/2ML IJ SOLN
INTRAMUSCULAR | Status: DC | PRN
  Administered 2019-02-28: 25 ug via INTRAVENOUS
  Administered 2019-02-28: 50 ug via INTRAVENOUS
  Administered 2019-02-28: 25 ug via INTRAVENOUS

## 2019-02-28 MED ORDER — MIDAZOLAM HCL 2 MG/2ML IJ SOLN
INTRAMUSCULAR | Status: AC
  Filled 2019-02-28: qty 2

## 2019-02-28 MED ORDER — FAMOTIDINE 20 MG PO TABS
ORAL_TABLET | ORAL | Status: AC
  Administered 2019-02-28: 20 mg via ORAL
  Filled 2019-02-28: qty 1

## 2019-02-28 MED ORDER — ONDANSETRON HCL 4 MG/2ML IJ SOLN
INTRAMUSCULAR | Status: AC
  Filled 2019-02-28: qty 2

## 2019-02-28 MED ORDER — FENTANYL CITRATE (PF) 100 MCG/2ML IJ SOLN: 25.0000 ug | INTRAMUSCULAR | Status: DC | PRN

## 2019-02-28 MED ORDER — FAMOTIDINE 20 MG PO TABS
20.0000 mg | ORAL_TABLET | Freq: Once | ORAL | Status: AC
  Administered 2019-02-28: 20 mg via ORAL

## 2019-02-28 MED ORDER — ONDANSETRON HCL 4 MG/2ML IJ SOLN
INTRAMUSCULAR | Status: DC | PRN
  Administered 2019-02-28: 4 mg via INTRAVENOUS

## 2019-02-28 MED ORDER — ACETAMINOPHEN ER 650 MG PO TBCR: 650.0000 mg | EXTENDED_RELEASE_TABLET | Freq: Three times a day (TID) | ORAL | 1 refills | Status: AC | PRN

## 2019-02-28 SURGICAL SUPPLY — 19 items
BAG INFUSER PRESSURE 100CC (MISCELLANEOUS) ×3 IMPLANT
CATH ROBINSON RED A/P 16FR (CATHETERS) ×3 IMPLANT
COVER WAND RF STERILE (DRAPES) IMPLANT
GLOVE BIO SURGEON STRL SZ 6.5 (GLOVE) ×2 IMPLANT
GLOVE BIO SURGEONS STRL SZ 6.5 (GLOVE) ×1
GLOVE INDICATOR 7.0 STRL GRN (GLOVE) ×3 IMPLANT
GOWN STRL REUS W/ TWL LRG LVL3 (GOWN DISPOSABLE) ×2 IMPLANT
GOWN STRL REUS W/TWL LRG LVL3 (GOWN DISPOSABLE) ×4
HANDPIECE ABLA MINERVA ENDO (MISCELLANEOUS) ×3 IMPLANT
IV LACTATED RINGER IRRG 3000ML (IV SOLUTION) ×2
IV LR IRRIG 3000ML ARTHROMATIC (IV SOLUTION) ×1 IMPLANT
KIT PROCEDURE FLUENT (KITS) IMPLANT
KIT TURNOVER CYSTO (KITS) ×3 IMPLANT
PACK DNC HYST (MISCELLANEOUS) ×3 IMPLANT
PAD OB MATERNITY 4.3X12.25 (PERSONAL CARE ITEMS) ×3 IMPLANT
PAD PREP 24X41 OB/GYN DISP (PERSONAL CARE ITEMS) ×3 IMPLANT
SOL PREP PVP 2OZ (MISCELLANEOUS)
SOLUTION PREP PVP 2OZ (MISCELLANEOUS) IMPLANT
SURGILUBE 2OZ TUBE FLIPTOP (MISCELLANEOUS) IMPLANT

## 2019-02-28 NOTE — Transfer of Care (Signed)
Immediate Anesthesia Transfer of Care Note  Patient: Melanie Nicholson  Procedure(s) Performed: DILATATION & CURETTAGE/HYSTEROSCOPY WITH MINERVA (N/A )  Patient Location: PACU  Anesthesia Type:General  Level of Consciousness: awake, alert  and oriented  Airway & Oxygen Therapy: Patient Spontanous Breathing and Patient connected to nasal cannula oxygen  Post-op Assessment: Report given to RN and Post -op Vital signs reviewed and stable  Post vital signs: Reviewed and stable  Last Vitals:  Vitals Value Taken Time  BP    Temp    Pulse 93 02/28/19 1320  Resp 0 02/28/19 1320  SpO2 100 % 02/28/19 1320  Vitals shown include unvalidated device data.  Last Pain:  Vitals:   02/28/19 1006  TempSrc: Tympanic  PainSc: 0-No pain         Complications: No apparent anesthesia complications

## 2019-02-28 NOTE — OR Nursing (Signed)
Blood sugar rechecked after bolus-  50.  Immediately rechecked -45. Pt denies any symptoms.   Dr Ronelle Nigh notified, orders for another 250 ml bolus.

## 2019-02-28 NOTE — Interval H&P Note (Signed)
History and Physical Interval Note:  02/28/2019 12:09 PM  Melanie Nicholson  has presented today for surgery, with the diagnosis of menotrorrhagia, anemia due to chronic blood loss.  The various methods of treatment have been discussed with the patient and family. After consideration of risks, benefits and other options for treatment, the patient has consented to  Procedure(s): [place] (N/A) as a surgical intervention.  The patient's history has been reviewed, patient examined, no change in status, stable for surgery.  I have reviewed the patient's chart and labs.  Questions were answered to the patient's satisfaction.     Rubie Maid, MD [facility]

## 2019-02-28 NOTE — OR Nursing (Signed)
Blood sugar on arrival 64. Dr Ronelle Nigh notified.  Orders for 250 ml bolus of d5 .45 % NS.  IV started and bolus given

## 2019-02-28 NOTE — Anesthesia Postprocedure Evaluation (Signed)
Anesthesia Post Note  Patient: Melanie Nicholson  Procedure(s) Performed: DILATATION & CURETTAGE/HYSTEROSCOPY WITH MINERVA (N/A )  Patient location during evaluation: PACU Anesthesia Type: General Level of consciousness: awake and alert Pain management: pain level controlled Vital Signs Assessment: post-procedure vital signs reviewed and stable Respiratory status: spontaneous breathing and respiratory function stable Cardiovascular status: stable Anesthetic complications: no     Last Vitals:  Vitals:   02/28/19 1006 02/28/19 1321  BP: 118/83 121/76  Pulse: 77 88  Resp: 16 17  Temp: 36.9 C (!) 36.4 C  SpO2:  100%    Last Pain:  Vitals:   02/28/19 1321  TempSrc:   PainSc: 0-No pain                 Reichen Hutzler K

## 2019-02-28 NOTE — Op Note (Addendum)
Hysteroscopy Procedure Note  Indications: 43 y.o. [id number] female with menometrorrhagia, history of anemia, hypothyroidism  Pre-operative Diagnosis: Menometrorrhagia, history of anemia, hypothyroidism  Post-operative Diagnosis: Same  Surgeon: Surgeon(s) and Role:    * Rubie Maid, MD - Primary   Assistants: None  Procedure:     * DILATATION & CURETTAGE/HYSTEROSCOPY WITH MINERVA ABLATION - Choice  Anesthesia: General endotracheal anesthesia  Procedure Details: Patient was seen in the preoperative holding area where the consent was reviewed. Patient was consented for the following procedure: Hysteroscopy Dilation and Curettage with Minerva endometrial ablation. Pt was taken to the operating room # 2.    The patient was then placed under general anesthesia without difficulty.  She was then prepped and draped in the normal sterile fashion, and placed in the dorsal lithotomy position.  A time out was performed.  An exam under anesthesia was performed with the findings noted above.  Straight catheterization was performed. A sterile speculum was inserted into vagina. A single-tooth tenaculum was used to grasp the anterior lip of the cervix. Cervical dilation was performed. A 5 mm hysteroscope was introduced into the uterus under direct visualization. The cavity was allowed to fill, and then the entire cavity was explored with the findings described above. The hysteroscope was removed, and a sharp curette was then passed into the uterus and endometrial sampling was collected for pathology.   Next the Minerva instrument was primed per instructions. The instrument was then placed into the endometrial canal and activated.    The Minerva instrument was then removed from the uterine cavity.  The hysteroscope was then re-introduced for final survey, with adequate charring of the endometrium noted.  The hysteroscope was removed from the patient's uterine cavity. The tenaculum was removed and excellent  hemostasis was noted. The speculum was removed from the vagina.   All instrument and sponge counts were correct at the end of the procedure x 2.  The patient tolerated the procedure well.  She was awakened and taken to the PACU in stable condition.   Findings: Uterus sounded to 10.5 cm.  Cervical length 3 cm.  Weakly proliferative endometrium.  Tubal ostia were visualized bilaterally.  No intrauterine masses.   Adequate charring of endometrial tissue post ablation.  No perforations noted.   Estimated Blood Loss:  5 ml      Drains: straight catheterization with 300 ml at start of procedure.          Total IV Fluids:  300 ml  Specimens:  Endometrial curettings         Implants: None         Complications:  None; patient tolerated the procedure well.         Disposition: PACU - hemodynamically stable.         Condition: stable   Rubie Maid, MD [facility]

## 2019-02-28 NOTE — Anesthesia Post-op Follow-up Note (Signed)
Anesthesia QCDR form completed.        

## 2019-02-28 NOTE — Anesthesia Preprocedure Evaluation (Signed)
Anesthesia Evaluation  Patient identified by MRN, date of birth, ID band Patient awake    Reviewed: Allergy & Precautions, NPO status , Patient's Chart, lab work & pertinent test results  History of Anesthesia Complications Negative for: history of anesthetic complications  Airway Mallampati: II       Dental   Pulmonary asthma , sleep apnea (not using CPAP after gastric bypass) , COPD,  COPD inhaler, Current Smoker,           Cardiovascular (-) hypertension(-) Past MI and (-) CHF (-) dysrhythmias (-) Valvular Problems/Murmurs     Neuro/Psych neg Seizures Anxiety Depression Bipolar Disorder    GI/Hepatic Neg liver ROS, neg GERD  ,  Endo/Other  neg diabetesHypothyroidism   Renal/GU negative Renal ROS     Musculoskeletal   Abdominal   Peds  Hematology  (+) anemia ,   Anesthesia Other Findings   Reproductive/Obstetrics                             Anesthesia Physical Anesthesia Plan  ASA: II  Anesthesia Plan: General   Post-op Pain Management:    Induction: Intravenous  PONV Risk Score and Plan: 2 and Dexamethasone and Ondansetron  Airway Management Planned: LMA  Additional Equipment:   Intra-op Plan:   Post-operative Plan:   Informed Consent: I have reviewed the patients History and Physical, chart, labs and discussed the procedure including the risks, benefits and alternatives for the proposed anesthesia with the patient or authorized representative who has indicated his/her understanding and acceptance.       Plan Discussed with:   Anesthesia Plan Comments:         Anesthesia Quick Evaluation

## 2019-02-28 NOTE — Discharge Instructions (Signed)
AMBULATORY SURGERY  DISCHARGE INSTRUCTIONS   1) The drugs that you were given will stay in your system until tomorrow so for the next 24 hours you should not:  A) Drive an automobile B) Make any legal decisions C) Drink any alcoholic beverage   2) You may resume regular meals tomorrow.  Today it is better to start with liquids and gradually work up to solid foods.  You may eat anything you prefer, but it is better to start with liquids, then soup and crackers, and gradually work up to solid foods.   3) Please notify your doctor immediately if you have any unusual bleeding, trouble breathing, redness and pain at the surgery site, drainage, fever, or pain not relieved by medication.    4) Additional Instructions:        Please contact your physician with any problems or Same Day Surgery at [phone], Monday through Friday 6 am to 4 pm, or [place] at [facility] number at [phone].  Endometrial Ablation, Care After This sheet gives you information about how to care for yourself after your procedure. Your health care provider may also give you more specific instructions. If you have problems or questions, contact your health care provider. What can I expect after the procedure? After the procedure, it is common to have:  A need to urinate more frequently than usual for the first 24 hours.  Cramps similar to menstrual cramps. These may last for 1-2 days.  Thin, watery vaginal discharge that is light pink or brown in color. This may last a few weeks. Discharge will be heavy for the first few days after your procedure. You may need to wear a sanitary pad.  Nausea.  Vaginal bleeding for 4-6 weeks after the procedure, as tissue healing occurs. Follow these instructions at home: Activity  Do not drive for 24 hours if you were given amedicine to help you relax (sedative) during your procedure.  Do not have sex or put anything into your vagina until your health care  provider approves.  Do not lift anything that is heavier than 10 lb (4.5 kg), or the limit that you are told, until your health care provider says that it is safe.  Return to your normal activities as told by your health care provider. Ask your health care provider what activities are safe for you. General instructions   Take over-the-counter and prescription medicines only as told by your health care provider.  Do not take baths, swim, or use a hot tub until your health care provider approves. You will be able to take showers.  Check your vaginal area every day for signs of infection. Check for: ? Redness, swelling, or pain. ? More discharge or blood, instead of less. ? Bad-smelling discharge.  Keep all follow-up visits as told by your health care provider. This is important.  Drink enough fluid to keep your urine pale yellow.  Contact a health care provider if you have:  Vaginal redness, swelling, or pain.  Vaginal discharge or bleeding that gets worse instead of getting better.  Bad-smelling vaginal discharge.  A fever or chills.  Trouble urinating. Get help right away if you have:  Heavy vaginal bleeding.  Severe cramps. Summary  After endometrial ablation, it is normal to have thin, watery vaginal discharge that is light pink or brown in color. This may last a few weeks and may be heavier right after the procedure.  Vaginal bleeding is also normal after the procedure and should get  better with time.  Check your vaginal area every day for signs of infection, such as bad-smelling discharge.  Keep all follow-up visits as told by your health care provider. This is important. This information is not intended to replace advice given to you by your health care provider. Make sure you discuss any questions you have with your health care provider. Document Released: 05/08/2017 Document Revised: 10/17/2018 Document Reviewed: 05/08/2017 Elsevier Patient Education  2020  Elsevier Inc.  

## 2019-02-28 NOTE — Anesthesia Procedure Notes (Signed)
Procedure Name: LMA Insertion Date/Time: 02/28/2019 12:25 PM Performed by: Bernardo Heater, CRNA Pre-anesthesia Checklist: Patient identified, Patient being monitored, Timeout performed, Emergency Drugs available and Suction available Patient Re-evaluated:Patient Re-evaluated prior to induction Oxygen Delivery Method: Circle system utilized Preoxygenation: Pre-oxygenation with 100% oxygen Induction Type: IV induction Ventilation: Mask ventilation without difficulty LMA: LMA inserted Tube type: Oral Number of attempts: 1 Placement Confirmation: positive ETCO2 and breath sounds checked- equal and bilateral Tube secured with: Tape Dental Injury: Teeth and Oropharynx as per pre-operative assessment

## 2019-03-01 ENCOUNTER — Encounter: Payer: Self-pay | Admitting: Obstetrics and Gynecology

## 2019-03-03 LAB — SURGICAL PATHOLOGY

## 2019-03-07 ENCOUNTER — Inpatient Hospital Stay: Payer: Medicaid Other | Attending: Oncology

## 2019-03-07 ENCOUNTER — Inpatient Hospital Stay: Payer: Medicaid Other | Admitting: Oncology

## 2019-03-11 ENCOUNTER — Encounter: Payer: Medicaid Other | Admitting: Obstetrics and Gynecology

## 2019-03-18 ENCOUNTER — Encounter: Payer: Medicaid Other | Admitting: Obstetrics and Gynecology

## 2019-06-09 ENCOUNTER — Other Ambulatory Visit: Payer: Self-pay

## 2019-06-09 DIAGNOSIS — Z20822 Contact with and (suspected) exposure to covid-19: Secondary | ICD-10-CM

## 2019-06-11 LAB — NOVEL CORONAVIRUS, NAA: SARS-CoV-2, NAA: NOT DETECTED

## 2019-06-17 ENCOUNTER — Other Ambulatory Visit: Payer: Self-pay | Admitting: Family Medicine

## 2019-06-17 DIAGNOSIS — N6310 Unspecified lump in the right breast, unspecified quadrant: Secondary | ICD-10-CM

## 2019-06-26 ENCOUNTER — Ambulatory Visit
Admission: RE | Admit: 2019-06-26 | Discharge: 2019-06-26 | Disposition: A | Discharge status: Home / Self Care | Payer: Medicaid Other | Source: Ambulatory Visit | Attending: Family Medicine | Admitting: Family Medicine

## 2019-06-26 ENCOUNTER — Encounter: Payer: Self-pay | Admitting: Radiology

## 2019-06-26 DIAGNOSIS — N6310 Unspecified lump in the right breast, unspecified quadrant: Secondary | ICD-10-CM

## 2019-06-26 DIAGNOSIS — N6313 Unspecified lump in the right breast, lower outer quadrant: Secondary | ICD-10-CM | POA: Diagnosis not present

## 2019-06-27 ENCOUNTER — Other Ambulatory Visit: Payer: Self-pay | Admitting: Family Medicine

## 2019-08-16 ENCOUNTER — Emergency Department: Payer: Medicaid Other

## 2019-08-16 ENCOUNTER — Emergency Department
Admission: EM | Admit: 2019-08-16 | Discharge: 2019-08-16 | Disposition: A | Discharge status: Home / Self Care | Payer: Medicaid Other | Attending: Emergency Medicine | Admitting: Emergency Medicine

## 2019-08-16 ENCOUNTER — Encounter: Payer: Self-pay | Admitting: Intensive Care

## 2019-08-16 ENCOUNTER — Other Ambulatory Visit: Payer: Self-pay

## 2019-08-16 DIAGNOSIS — E039 Hypothyroidism, unspecified: Secondary | ICD-10-CM | POA: Insufficient documentation

## 2019-08-16 DIAGNOSIS — J449 Chronic obstructive pulmonary disease, unspecified: Secondary | ICD-10-CM | POA: Diagnosis not present

## 2019-08-16 DIAGNOSIS — M5134 Other intervertebral disc degeneration, thoracic region: Secondary | ICD-10-CM | POA: Insufficient documentation

## 2019-08-16 DIAGNOSIS — Z79899 Other long term (current) drug therapy: Secondary | ICD-10-CM | POA: Diagnosis not present

## 2019-08-16 DIAGNOSIS — F17210 Nicotine dependence, cigarettes, uncomplicated: Secondary | ICD-10-CM | POA: Diagnosis not present

## 2019-08-16 DIAGNOSIS — M546 Pain in thoracic spine: Secondary | ICD-10-CM | POA: Diagnosis present

## 2019-08-16 LAB — URINALYSIS, COMPLETE (UACMP) WITH MICROSCOPIC
Bacteria, UA: NONE SEEN
Bilirubin Urine: NEGATIVE
Glucose, UA: NEGATIVE mg/dL
Hgb urine dipstick: NEGATIVE
Ketones, ur: NEGATIVE mg/dL
Leukocytes,Ua: NEGATIVE
Nitrite: NEGATIVE
Protein, ur: NEGATIVE mg/dL
Specific Gravity, Urine: 1.02 (ref 1.005–1.030)
pH: 5 (ref 5.0–8.0)

## 2019-08-16 MED ORDER — TRAMADOL HCL 50 MG PO TABS: 50.0000 mg | ORAL_TABLET | Freq: Four times a day (QID) | ORAL | 0 refills | Status: DC | PRN

## 2019-08-16 MED ORDER — PREDNISONE 10 MG (21) PO TBPK: ORAL_TABLET | ORAL | 0 refills | Status: DC

## 2019-08-16 MED ORDER — KETOROLAC TROMETHAMINE 30 MG/ML IJ SOLN
30.0000 mg | Freq: Once | INTRAMUSCULAR | Status: AC
  Administered 2019-08-16: 12:00:00 30 mg via INTRAMUSCULAR
  Filled 2019-08-16: qty 1

## 2019-08-16 NOTE — ED Notes (Signed)
See triage note  Presents with mid back pain  States is mainly at bra line  Non radiating   Denies any trauma,fever or cough  States she tried her friends muscle relaxer w/o relief  Ambulates well

## 2019-08-16 NOTE — ED Triage Notes (Signed)
First Nurse:  Pt ambulatory to triage c/o back pain.  NAD noted at present.

## 2019-08-16 NOTE — Discharge Instructions (Addendum)
Follow-up with critical clinic neurosurgery.  Please call for an appointment.  Use medications as prescribed.  Return if worsening.

## 2019-08-16 NOTE — ED Notes (Signed)
Pt transported to Xray. 

## 2019-08-16 NOTE — ED Triage Notes (Signed)
PAtient c/o back pain that has gotten worse the last two days. PAtient drove herself to ER.

## 2019-08-16 NOTE — ED Provider Notes (Signed)
[facility] Emergency Department Provider Note  ____________________________________________   First MD Initiated Contact with Patient 08/16/19 1109     (approximate)  I have reviewed the triage vital signs and the nursing notes.   HISTORY  Chief Complaint Back Pain    HPI Melanie Nicholson is a 44 y.o. female presents emergency department complaining of mid back pain.  States pain runs along the bra line.  No fever or chills.  No blood in the urine.  She used a muscle relaxer without relief.  Pain has been ongoing for a while but has worsened over the last 2 days.  She denies abdominal pain.  No chest pain or shortness of breath.  No dysuria.    Past Medical History:  Diagnosis Date  . ADD (attention deficit disorder)   . Anxiety   . Asthma   . Bipolar affective ([place])   . COPD (chronic obstructive pulmonary disease) ([place])   . Depression   . Gastric bypass status for obesity   . Hypothyroidism   . Insomnia   . Iron deficiency anemia due to chronic blood loss 09/11/2018  . Pre-diabetes   . Reflux   . Sleep apnea   . Thyroid disease     Patient Active Problem List   Diagnosis Date Noted  . Menometrorrhagia 02/11/2019  . Iron deficiency anemia due to chronic blood loss 09/11/2018  . History of cesarean section 03/02/2016  . History of ectopic pregnancy 03/02/2016  . History of cholecystectomy 03/02/2016  . History of gastric bypass 03/02/2016    Past Surgical History:  Procedure Laterality Date  . CESAREAN SECTION     x4  . CHOLECYSTECTOMY    . DILATION AND CURETTAGE OF UTERUS    . ECTOPIC PREGNANCY SURGERY    . GALLBLADDER SURGERY    . gastic bypass     05/2014  . HYSTEROSCOPY WITH D & C N/A 02/28/2019   Procedure: DILATATION & CURETTAGE/HYSTEROSCOPY WITH MINERVA;  Surgeon: Rubie Maid, MD;  Location: [facility] ORS;  Service: Gynecology;  Laterality: N/A;  . TONSILLECTOMY      Prior to Admission medications   Medication Sig Start Date  End Date Taking? Authorizing Provider  acetaminophen (TYLENOL 8 HOUR) 650 MG CR tablet Take 1 tablet (650 mg total) by mouth every 8 (eight) hours as needed for pain. 02/28/19   Rubie Maid, MD  clonazePAM (KLONOPIN) 1 MG tablet Take 1 mg by mouth 2 (two) times daily.    [provider]  ferrous sulfate 325 (65 FE) MG tablet Take 325 mg by mouth 2 (two) times a week.    [provider]  LATUDA 80 MG TABS tablet Take 80 mg by mouth at bedtime. 05/09/18   [provider]  levothyroxine (SYNTHROID, LEVOTHROID) 200 MCG tablet Take 200 mcg by mouth daily before breakfast.    [provider]  liraglutide (VICTOZA) 18 MG/3ML SOPN Inject 1.2 mg into the skin daily.    [provider]  lisdexamfetamine (VYVANSE) 60 MG capsule Take 60 mg by mouth every morning.     [provider]  Multiple Vitamins-Minerals (BARIATRIC MULTIVITAMINS/IRON) CAPS Take 1 capsule by mouth 2 (two) times a week.     [provider]  predniSONE (STERAPRED UNI-PAK 21 TAB) 10 MG (21) TBPK tablet Take 6 pills on day one then decrease by 1 pill each day 08/16/19   Versie Starks, PA-C  traMADol (ULTRAM) 50 MG tablet Take 1 tablet (50 mg total) by  mouth every 6 (six) hours as needed. 08/16/19   Faythe Ghee, PA-C    Allergies Nsaids  Family History  Problem Relation Age of Onset  . Mental illness Mother   . Heart disease Father   . Thyroid disease Father   . Diabetes Paternal Grandmother   . Ovarian cancer Maternal Aunt   . Ovarian cancer Cousin   . Thyroid cancer Cousin   . Ovarian cancer Maternal Aunt   . Ovarian cancer Maternal Aunt   . Breast cancer Neg Hx   . Colon cancer Neg Hx     Social History Social History   Tobacco Use  . Smoking status: Current Every Day Smoker    Types: Cigarettes  . Smokeless tobacco: Never Used  . Tobacco comment: 8 cigarretes a day  Substance Use Topics  . Alcohol use: Yes    Alcohol/week: 0.0 standard drinks     Comment: occas  . Drug use: Yes    Types: Marijuana    Comment: "every now and then"    Review of Systems  Constitutional: No fever/chills Eyes: No visual changes. ENT: No sore throat. Respiratory: Denies cough Cardiovascular: Denies chest pain Gastrointestinal: Denies abdominal pain Genitourinary: Negative for dysuria. Musculoskeletal: Positive for back pain. Skin: Negative for rash. Psychiatric: no mood changes,     ____________________________________________   PHYSICAL EXAM:  VITAL SIGNS: ED Triage Vitals  Enc Vitals Group     BP 08/16/19 1051 118/73     Pulse Rate 08/16/19 1051 73     Resp 08/16/19 1051 16     Temp 08/16/19 1051 98.8 F (37.1 C)     Temp Source 08/16/19 1051 Oral     SpO2 08/16/19 1051 100 %     Weight 08/16/19 1052 160 lb (72.6 kg)     Height 08/16/19 1052 5\' 2"  (1.575 m)     Head Circumference --      Peak Flow --      Pain Score 08/16/19 1105 5     Pain Loc --      Pain Edu? --      Excl. in GC? --     Constitutional: Alert and oriented. Well appearing and in no acute distress. Eyes: Conjunctivae are normal.  Head: Atraumatic. Nose: No congestion/rhinnorhea. Mouth/Throat: Mucous membranes are moist.   Neck:  supple no lymphadenopathy noted Cardiovascular: Normal rate, regular rhythm. Heart sounds are normal Respiratory: Normal respiratory effort.  No retractions, lungs c t a  Abd: soft nontender bs normal all 4 quad, no hepatosplenomegaly noted, no CVA tenderness GU: deferred Musculoskeletal: FROM all extremities, warm and well perfused, spine is tender across the mid back along the bra line. Neurologic:  Normal speech and language.  Skin:  Skin is warm, dry and intact. No rash noted. Psychiatric: Mood and affect are normal. Speech and behavior are normal.  ____________________________________________   LABS (all labs ordered are listed, but only abnormal results are displayed)  Labs Reviewed  URINALYSIS, COMPLETE (UACMP)  WITH MICROSCOPIC - Abnormal; Notable for the following components:      Result Value   Color, Urine YELLOW (*)    APPearance CLEAR (*)    All other components within normal limits   ____________________________________________   ____________________________________________  RADIOLOGY  X-ray of thoracic spine shows degenerative disc disease  ____________________________________________   PROCEDURES  Procedure(s) performed: Toradol 30 mg IM  Procedures    ____________________________________________   INITIAL IMPRESSION / ASSESSMENT AND PLAN / ED COURSE  Pertinent labs &  imaging results that were available during my care of the patient were reviewed by me and considered in my medical decision making (see chart for details).   Patient is a 44 year old female presents emergency department with back pain.  See HPI  Physical exam shows area to be tender across the bra line.  UA, x-ray thoracic spine  X-ray of the thoracic spine show degenerative disc disease.  UA is normal.  Explained all the findings to the patient.  She was encouraged to follow-up with neurosurgery for questionable injection versus evaluation for surgery.  She was given a prescription for Sterapred and tramadol.  Due to her gastric bypass she is unable to take oral ibuprofen.  She is to apply wet heat.  Return emergency department worsening.  States she understands will comply.  She is discharged stable condition.   Melanie Nicholson was evaluated in Emergency Department on 08/16/2019 for the symptoms described in the history of present illness. She was evaluated in the context of the global COVID-19 pandemic, which necessitated consideration that the patient might be at risk for infection with the SARS-CoV-2 virus that causes COVID-19. Institutional protocols and algorithms that pertain to the evaluation of patients at risk for COVID-19 are in a state of rapid change based on information released by regulatory  bodies including the CDC and federal and state organizations. These policies and algorithms were followed during the patient's care in the ED.   As part of my medical decision making, I reviewed the following data within the electronic MEDICAL RECORD NUMBER Nursing notes reviewed and incorporated, Old chart reviewed, Radiograph reviewed , Notes from prior ED visits and [place] Controlled Substance Database  ____________________________________________   FINAL CLINICAL IMPRESSION(S) / ED DIAGNOSES  Final diagnoses:  Degenerative disc disease, thoracic      NEW MEDICATIONS STARTED DURING THIS VISIT:  Discharge Medication List as of 08/16/2019 12:24 PM    START taking these medications   Details  predniSONE (STERAPRED UNI-PAK 21 TAB) 10 MG (21) TBPK tablet Take 6 pills on day one then decrease by 1 pill each day, Normal    traMADol (ULTRAM) 50 MG tablet Take 1 tablet (50 mg total) by mouth every 6 (six) hours as needed., Starting Sat 08/16/2019, Normal         Note:  This document was prepared using Dragon voice recognition software and may include unintentional dictation errors.    Faythe Ghee, PA-C 08/16/19 1503    Dionne Bucy, MD 08/16/19 [phone]

## 2019-09-19 ENCOUNTER — Emergency Department
Admission: EM | Admit: 2019-09-19 | Discharge: 2019-09-19 | Disposition: A | Discharge status: Home / Self Care | Payer: Medicaid Other | Attending: Emergency Medicine | Admitting: Emergency Medicine

## 2019-09-19 ENCOUNTER — Emergency Department: Payer: Medicaid Other

## 2019-09-19 ENCOUNTER — Other Ambulatory Visit: Payer: Self-pay

## 2019-09-19 ENCOUNTER — Encounter: Payer: Self-pay | Admitting: Emergency Medicine

## 2019-09-19 DIAGNOSIS — F17210 Nicotine dependence, cigarettes, uncomplicated: Secondary | ICD-10-CM | POA: Insufficient documentation

## 2019-09-19 DIAGNOSIS — R519 Headache, unspecified: Secondary | ICD-10-CM | POA: Diagnosis present

## 2019-09-19 DIAGNOSIS — J45909 Unspecified asthma, uncomplicated: Secondary | ICD-10-CM | POA: Insufficient documentation

## 2019-09-19 DIAGNOSIS — Z79899 Other long term (current) drug therapy: Secondary | ICD-10-CM | POA: Insufficient documentation

## 2019-09-19 DIAGNOSIS — E039 Hypothyroidism, unspecified: Secondary | ICD-10-CM | POA: Insufficient documentation

## 2019-09-19 LAB — CBC WITH DIFFERENTIAL/PLATELET
Abs Immature Granulocytes: 0.01 10*3/uL (ref 0.00–0.07)
Basophils Absolute: 0 10*3/uL (ref 0.0–0.1)
Basophils Relative: 1 %
Eosinophils Absolute: 0.2 10*3/uL (ref 0.0–0.5)
Eosinophils Relative: 4 %
HCT: 37.7 % (ref 36.0–46.0)
Hemoglobin: 12.4 g/dL (ref 12.0–15.0)
Immature Granulocytes: 0 %
Lymphocytes Relative: 42 %
Lymphs Abs: 2.1 10*3/uL (ref 0.7–4.0)
MCH: 30.2 pg (ref 26.0–34.0)
MCHC: 32.9 g/dL (ref 30.0–36.0)
MCV: 91.7 fL (ref 80.0–100.0)
Monocytes Absolute: 0.5 10*3/uL (ref 0.1–1.0)
Monocytes Relative: 10 %
Neutro Abs: 2.2 10*3/uL (ref 1.7–7.7)
Neutrophils Relative %: 43 %
Platelets: 175 10*3/uL (ref 150–400)
RBC: 4.11 MIL/uL (ref 3.87–5.11)
RDW: 12.7 % (ref 11.5–15.5)
WBC: 5 10*3/uL (ref 4.0–10.5)
nRBC: 0 % (ref 0.0–0.2)

## 2019-09-19 LAB — BASIC METABOLIC PANEL
Anion gap: 5 (ref 5–15)
BUN: 11 mg/dL (ref 6–20)
CO2: 26 mmol/L (ref 22–32)
Calcium: 8.1 mg/dL — ABNORMAL LOW (ref 8.9–10.3)
Chloride: 110 mmol/L (ref 98–111)
Creatinine, Ser: 0.75 mg/dL (ref 0.44–1.00)
GFR calc Af Amer: >60 mL/min (ref >60)
GFR calc non Af Amer: >60 mL/min (ref >60)
Glucose, Bld: 106 mg/dL — ABNORMAL HIGH (ref 70–99)
Potassium: 4 mmol/L (ref 3.5–5.1)
Sodium: 141 mmol/L (ref 135–145)

## 2019-09-19 NOTE — ED Notes (Addendum)
Right eye coverd: 20/40 Left Eye coverd: 20/40

## 2019-09-19 NOTE — Discharge Instructions (Addendum)
Continue previous medication pending evaluation by neurology.  Call the clinic 830 Monday morning and tell them you are follow-up from the emergency room.

## 2019-09-19 NOTE — ED Triage Notes (Addendum)
Pt with headaches x 1.5 months that start between 1500 and 1700 each day. Pt has seen her PCP and been started on BP meds, HA meds, allergy meds without relief. Pt is concerned that no xrays, blood work has been done.

## 2019-09-19 NOTE — ED Provider Notes (Signed)
[facility] Emergency Department Provider Note   ____________________________________________   First MD Initiated Contact with Patient 09/19/19 1433     (approximate)  I have reviewed the triage vital signs and the nursing notes.   HISTORY  Chief Complaint Headache    HPI Melanie Nicholson is a 44 y.o. female patient presents with 1/2 months of diffuse headache always starts in the afternoon.  Patient states she saw her PCP who started her on a blood pressure medication.  Patient states this is not controlled her headaches.  Patient cannot determine a trigger for the headaches.  Headache is not associated with nausea or photophobia.  Patient denies aura.         Past Medical History:  Diagnosis Date  . ADD (attention deficit disorder)   . Anxiety   . Asthma   . Bipolar affective (HCC)   . COPD (chronic obstructive pulmonary disease) (HCC)   . Depression   . Gastric bypass status for obesity   . Hypothyroidism   . Insomnia   . Iron deficiency anemia due to chronic blood loss 09/11/2018  . Pre-diabetes   . Reflux   . Sleep apnea   . Thyroid disease     Patient Active Problem List   Diagnosis Date Noted  . Menometrorrhagia 02/11/2019  . Iron deficiency anemia due to chronic blood loss 09/11/2018  . History of cesarean section 03/02/2016  . History of ectopic pregnancy 03/02/2016  . History of cholecystectomy 03/02/2016  . History of gastric bypass 03/02/2016    Past Surgical History:  Procedure Laterality Date  . CESAREAN SECTION     x4  . CHOLECYSTECTOMY    . DILATION AND CURETTAGE OF UTERUS    . ECTOPIC PREGNANCY SURGERY    . GALLBLADDER SURGERY    . gastic bypass     05/2014  . HYSTEROSCOPY WITH D & C N/A 02/28/2019   Procedure: DILATATION & CURETTAGE/HYSTEROSCOPY WITH MINERVA;  Surgeon: Hildred Laser, MD;  Location: [facility] ORS;  Service: Gynecology;  Laterality: N/A;  . TONSILLECTOMY      Prior to Admission medications     Medication Sig Start Date End Date Taking? Authorizing Provider  acetaminophen (TYLENOL 8 HOUR) 650 MG CR tablet Take 1 tablet (650 mg total) by mouth every 8 (eight) hours as needed for pain. 02/28/19   Hildred Laser, MD  clonazePAM (KLONOPIN) 1 MG tablet Take 1 mg by mouth 2 (two) times daily.    [provider]  ferrous sulfate 325 (65 FE) MG tablet Take 325 mg by mouth 2 (two) times a week.    [provider]  LATUDA 80 MG TABS tablet Take 80 mg by mouth at bedtime. 05/09/18   [provider]  levothyroxine (SYNTHROID, LEVOTHROID) 200 MCG tablet Take 200 mcg by mouth daily before breakfast.    [provider]  liraglutide (VICTOZA) 18 MG/3ML SOPN Inject 1.2 mg into the skin daily.    [provider]  lisdexamfetamine (VYVANSE) 60 MG capsule Take 60 mg by mouth every morning.     [provider]  Multiple Vitamins-Minerals (BARIATRIC MULTIVITAMINS/IRON) CAPS Take 1 capsule by mouth 2 (two) times a week.     [provider]  predniSONE (STERAPRED UNI-PAK 21 TAB) 10 MG (21) TBPK tablet Take 6 pills on day one then decrease by 1 pill each day 08/16/19   Faythe Ghee, PA-C  traMADol (ULTRAM) 50 MG tablet Take 1 tablet (50 mg total) by mouth every  6 (six) hours as needed. 08/16/19   Faythe Ghee, PA-C    Allergies Nsaids  Family History  Problem Relation Age of Onset  . Mental illness Mother   . Heart disease Father   . Thyroid disease Father   . Diabetes Paternal Grandmother   . Ovarian cancer Maternal Aunt   . Ovarian cancer Cousin   . Thyroid cancer Cousin   . Ovarian cancer Maternal Aunt   . Ovarian cancer Maternal Aunt   . Breast cancer Neg Hx   . Colon cancer Neg Hx     Social History Social History   Tobacco Use  . Smoking status: Current Every Day Smoker    Types: Cigarettes  . Smokeless tobacco: Never Used  . Tobacco comment: 8 cigarretes a day  Substance Use Topics  . Alcohol use: Yes    Alcohol/week:  0.0 standard drinks    Comment: occas  . Drug use: Yes    Types: Marijuana    Comment: "every now and then"    Review of Systems Constitutional: No fever/chills Eyes: No visual changes. ENT: No sore throat. Cardiovascular: Denies chest pain. Respiratory: Denies shortness of breath. Gastrointestinal: No abdominal pain.  No nausea, no vomiting.  No diarrhea.  No constipation. Genitourinary: Negative for dysuria. Musculoskeletal: Negative for back pain. Skin: Negative for rash. Neurological: Positive for headaches, but denies focal weakness or numbness. Psychiatric:  ADD, anxiety, bipolar, and depression. Endocrine:  Hypertension and hypothyroidism. Allergic/Immunilogical: NSAIDs  ____________________________________________   PHYSICAL EXAM:  VITAL SIGNS: ED Triage Vitals  Enc Vitals Group     BP 09/19/19 1314 104/71     Pulse Rate 09/19/19 1314 95     Resp --      Temp 09/19/19 1314 98.8 F (37.1 C)     Temp Source 09/19/19 1314 Oral     SpO2 09/19/19 1314 100 %     Weight 09/19/19 1315 165 lb (74.8 kg)     Height 09/19/19 1315 5\' 2"  (1.575 m)     Head Circumference --      Peak Flow --      Pain Score 09/19/19 1315 0     Pain Loc --      Pain Edu? --      Excl. in GC? --    Constitutional: Alert and oriented. Well appearing and in no acute distress. Eyes: Conjunctivae are normal. PERRL. EOMI. visual acuity is 20/40 bilateral. Head: Atraumatic. Nose: No congestion/rhinnorhea. Mouth/Throat: Mucous membranes are moist.  Oropharynx non-erythematous. Cardiovascular: Normal rate, regular rhythm. Grossly normal heart sounds.  Good peripheral circulation. Respiratory: Normal respiratory effort.  No retractions. Lungs CTAB. Genitourinary: Deferred Neurologic:  Normal speech and language. No gross focal neurologic deficits are appreciated. No gait instability. Skin:  Skin is warm, dry and intact. No rash noted. Psychiatric: Mood and affect are normal. Speech and behavior  are normal.  ____________________________________________   LABS (all labs ordered are listed, but only abnormal results are displayed)  Labs Reviewed  BASIC METABOLIC PANEL - Abnormal; Notable for the following components:      Result Value   Glucose, Bld 106 (*)    Calcium 8.1 (*)    All other components within normal limits  CBC WITH DIFFERENTIAL/PLATELET   ____________________________________________  EKG   ____________________________________________  RADIOLOGY  ED MD interpretation:    Official radiology report(s): CT Head Wo Contrast  Result Date: 09/19/2019 CLINICAL DATA:  Headache. EXAM: CT HEAD WITHOUT CONTRAST TECHNIQUE: Contiguous axial images were obtained from  the base of the skull through the vertex without intravenous contrast. COMPARISON:  April 21, 2010. FINDINGS: Brain: No evidence of acute infarction, hemorrhage, hydrocephalus, extra-axial collection or mass lesion/mass effect. Vascular: No hyperdense vessel or unexpected calcification. Skull: Normal. Negative for fracture or focal lesion. Sinuses/Orbits: No acute finding. Other: None. IMPRESSION: Normal head CT. Electronically Signed   By: Marijo Conception M.D.   On: 09/19/2019 14:00    ____________________________________________   PROCEDURES  Procedure(s) performed (including Critical Care):  Procedures   ____________________________________________   INITIAL IMPRESSION / ASSESSMENT AND PLAN / ED COURSE  As part of my medical decision making, I reviewed the following data within the [place]     Patient presents with 1.5 months of headache.  Patient states initially her PCP thought it was secondary to hypertension.  Patient's blood pressure is now controlled butl continue to have headaches.  Discussed negative CT findings with patient.  Patient given discharge care instruction consult to neurology for definitive evaluation.    Melanie Nicholson was evaluated in Emergency  Department on 09/19/2019 for the symptoms described in the history of present illness. She was evaluated in the context of the global COVID-19 pandemic, which necessitated consideration that the patient might be at risk for infection with the SARS-CoV-2 virus that causes COVID-19. Institutional protocols and algorithms that pertain to the evaluation of patients at risk for COVID-19 are in a state of rapid change based on information released by regulatory bodies including the CDC and federal and state organizations. These policies and algorithms were followed during the patient's care in the ED.       ____________________________________________   FINAL CLINICAL IMPRESSION(S) / ED DIAGNOSES  Final diagnoses:  Headache disorder     ED Discharge Orders    None       Note:  This document was prepared using Dragon voice recognition software and may include unintentional dictation errors.    Sable Feil, PA-C 09/19/19 1448    Vanessa White Salmon, MD 09/20/19 [phone]

## 2020-02-23 ENCOUNTER — Other Ambulatory Visit: Payer: Self-pay

## 2020-02-23 ENCOUNTER — Encounter: Payer: Self-pay | Admitting: Emergency Medicine

## 2020-02-23 ENCOUNTER — Emergency Department
Admission: EM | Admit: 2020-02-23 | Discharge: 2020-02-23 | Disposition: A | Discharge status: Home / Self Care | Payer: Medicaid Other | Attending: Emergency Medicine | Admitting: Emergency Medicine

## 2020-02-23 DIAGNOSIS — Z5321 Procedure and treatment not carried out due to patient leaving prior to being seen by health care provider: Secondary | ICD-10-CM | POA: Diagnosis not present

## 2020-02-23 DIAGNOSIS — R1031 Right lower quadrant pain: Secondary | ICD-10-CM | POA: Insufficient documentation

## 2020-02-23 LAB — CBC
HCT: 35.9 % — ABNORMAL LOW (ref 36.0–46.0)
Hemoglobin: 12.2 g/dL (ref 12.0–15.0)
MCH: 30.9 pg (ref 26.0–34.0)
MCHC: 34 g/dL (ref 30.0–36.0)
MCV: 90.9 fL (ref 80.0–100.0)
Platelets: 175 10*3/uL (ref 150–400)
RBC: 3.95 MIL/uL (ref 3.87–5.11)
RDW: 12.2 % (ref 11.5–15.5)
WBC: 6.7 10*3/uL (ref 4.0–10.5)
nRBC: 0 % (ref 0.0–0.2)

## 2020-02-23 LAB — LIPASE, BLOOD: Lipase: 34 U/L (ref 11–51)

## 2020-02-23 LAB — COMPREHENSIVE METABOLIC PANEL
ALT: 14 U/L (ref 0–44)
AST: 16 U/L (ref 15–41)
Albumin: 3.9 g/dL (ref 3.5–5.0)
Alkaline Phosphatase: 27 U/L — ABNORMAL LOW (ref 38–126)
Anion gap: 10 (ref 5–15)
BUN: 11 mg/dL (ref 6–20)
CO2: 24 mmol/L (ref 22–32)
Calcium: 8.7 mg/dL — ABNORMAL LOW (ref 8.9–10.3)
Chloride: 103 mmol/L (ref 98–111)
Creatinine, Ser: 0.73 mg/dL (ref 0.44–1.00)
GFR calc Af Amer: >60 mL/min (ref >60)
GFR calc non Af Amer: >60 mL/min (ref >60)
Glucose, Bld: 108 mg/dL — ABNORMAL HIGH (ref 70–99)
Potassium: 4.1 mmol/L (ref 3.5–5.1)
Sodium: 137 mmol/L (ref 135–145)
Total Bilirubin: 0.8 mg/dL (ref 0.3–1.2)
Total Protein: 6.5 g/dL (ref 6.5–8.1)

## 2020-02-23 NOTE — ED Notes (Signed)
Called times 2   Still no answer

## 2020-02-23 NOTE — ED Triage Notes (Signed)
Patient with complaint of right lower abdominal pain that started about 14:00 yesterday afternoon. Patient denies nausea/ vomiting.

## 2020-02-23 NOTE — ED Notes (Signed)
Called No answer.

## 2020-03-03 ENCOUNTER — Other Ambulatory Visit: Payer: Self-pay | Admitting: Family Medicine

## 2020-03-03 DIAGNOSIS — N6320 Unspecified lump in the left breast, unspecified quadrant: Secondary | ICD-10-CM

## 2020-03-09 ENCOUNTER — Ambulatory Visit: Payer: Medicaid Other | Attending: Internal Medicine

## 2020-03-09 DIAGNOSIS — Z23 Encounter for immunization: Secondary | ICD-10-CM

## 2020-03-09 NOTE — Progress Notes (Signed)
° °  Covid-19 Vaccination Clinic  Name:  Melanie Nicholson    MRN: [id number] DOB: 03-18-1976  03/09/2020  [name] was observed post Covid-19 immunization for 15 minutes without incident. She was provided with Vaccine Information Sheet and instruction to access the V-Safe system.   [name] was instructed to call 911 with any severe reactions post vaccine:  Difficulty breathing   Swelling of face and throat   A fast heartbeat   A bad rash all over body   Dizziness and weakness   Immunizations Administered    Name Date Dose VIS Date Route   Pfizer COVID-19 Vaccine 03/09/2020 12:07 PM 0.3 mL 09/03/2018 Intramuscular   Manufacturer: [employer], [employer]   Lot: [id number]   NDC: [address]-0

## 2020-03-30 ENCOUNTER — Ambulatory Visit: Payer: Medicaid Other

## 2020-05-01 ENCOUNTER — Other Ambulatory Visit: Payer: Self-pay

## 2020-05-01 ENCOUNTER — Emergency Department
Admission: EM | Admit: 2020-05-01 | Discharge: 2020-05-01 | Disposition: A | Discharge status: Home / Self Care | Payer: Medicaid Other | Attending: Emergency Medicine | Admitting: Emergency Medicine

## 2020-05-01 ENCOUNTER — Encounter: Payer: Self-pay | Admitting: Emergency Medicine

## 2020-05-01 ENCOUNTER — Emergency Department: Payer: Medicaid Other

## 2020-05-01 DIAGNOSIS — T148XXA Other injury of unspecified body region, initial encounter: Secondary | ICD-10-CM

## 2020-05-01 DIAGNOSIS — X58XXXA Exposure to other specified factors, initial encounter: Secondary | ICD-10-CM | POA: Insufficient documentation

## 2020-05-01 DIAGNOSIS — J45909 Unspecified asthma, uncomplicated: Secondary | ICD-10-CM | POA: Insufficient documentation

## 2020-05-01 DIAGNOSIS — Z79890 Hormone replacement therapy (postmenopausal): Secondary | ICD-10-CM | POA: Insufficient documentation

## 2020-05-01 DIAGNOSIS — M79605 Pain in left leg: Secondary | ICD-10-CM

## 2020-05-01 DIAGNOSIS — S86912A Strain of unspecified muscle(s) and tendon(s) at lower leg level, left leg, initial encounter: Secondary | ICD-10-CM | POA: Diagnosis not present

## 2020-05-01 DIAGNOSIS — S8992XA Unspecified injury of left lower leg, initial encounter: Secondary | ICD-10-CM | POA: Diagnosis present

## 2020-05-01 DIAGNOSIS — J449 Chronic obstructive pulmonary disease, unspecified: Secondary | ICD-10-CM | POA: Diagnosis not present

## 2020-05-01 DIAGNOSIS — F17210 Nicotine dependence, cigarettes, uncomplicated: Secondary | ICD-10-CM | POA: Insufficient documentation

## 2020-05-01 DIAGNOSIS — E039 Hypothyroidism, unspecified: Secondary | ICD-10-CM | POA: Insufficient documentation

## 2020-05-01 NOTE — ED Notes (Signed)
Pt denies pain at this time. Pt states she just had surgery and was worried about a blood clot. No blood clot seen. Pt to follow up with her surgeon Wednesday.

## 2020-05-01 NOTE — ED Triage Notes (Signed)
Pt reports was seen at [facility] last pm and was told to come to [facility] ED this am and have an [place] of her left leg to rule out a clot. Pt reports pain from knee down

## 2020-05-01 NOTE — ED Provider Notes (Signed)
[facility] Emergency Department Provider Note   ____________________________________________    I have reviewed the triage vital signs and the nursing notes.   HISTORY  Chief Complaint Leg Pain     HPI TAYGEN NEWSOME is a 44 y.o. female who presents with complaints of left posterior calf pain x2 days.  She reports she recently had a surgery and became concerned that she may have a blood clot.  She went to [facility] last night and was told that she had an elevated D-dimer but they did not have the capability of doing an ultrasound overnight.  Denies pleurisy or shortness of breath.  No fevers or chills.  She reports possible overuse injury to her legs because of her recent surgery.  Past Medical History:  Diagnosis Date  . ADD (attention deficit disorder)   . Anxiety   . Asthma   . Bipolar affective (HCC)   . COPD (chronic obstructive pulmonary disease) (HCC)   . Depression   . Gastric bypass status for obesity   . Hypothyroidism   . Insomnia   . Iron deficiency anemia due to chronic blood loss 09/11/2018  . Pre-diabetes   . Reflux   . Sleep apnea   . Thyroid disease     Patient Active Problem List   Diagnosis Date Noted  . Menometrorrhagia 02/11/2019  . Iron deficiency anemia due to chronic blood loss 09/11/2018  . History of cesarean section 03/02/2016  . History of ectopic pregnancy 03/02/2016  . History of cholecystectomy 03/02/2016  . History of gastric bypass 03/02/2016    Past Surgical History:  Procedure Laterality Date  . CESAREAN SECTION     x4  . CHOLECYSTECTOMY    . DILATION AND CURETTAGE OF UTERUS    . ECTOPIC PREGNANCY SURGERY    . GALLBLADDER SURGERY    . gastic bypass     05/2014  . HYSTEROSCOPY WITH D & C N/A 02/28/2019   Procedure: DILATATION & CURETTAGE/HYSTEROSCOPY WITH MINERVA;  Surgeon: Hildred Laser, MD;  Location: [facility] ORS;  Service: Gynecology;  Laterality: N/A;  . TONSILLECTOMY      Prior to  Admission medications   Medication Sig Start Date End Date Taking? Authorizing Provider  acetaminophen (TYLENOL 8 HOUR) 650 MG CR tablet Take 1 tablet (650 mg total) by mouth every 8 (eight) hours as needed for pain. 02/28/19   Hildred Laser, MD  clonazePAM (KLONOPIN) 1 MG tablet Take 1 mg by mouth 2 (two) times daily.    [provider]  ferrous sulfate 325 (65 FE) MG tablet Take 325 mg by mouth 2 (two) times a week.    [provider]  LATUDA 80 MG TABS tablet Take 80 mg by mouth at bedtime. 05/09/18   [provider]  levothyroxine (SYNTHROID, LEVOTHROID) 200 MCG tablet Take 200 mcg by mouth daily before breakfast.    [provider]  liraglutide (VICTOZA) 18 MG/3ML SOPN Inject 1.2 mg into the skin daily.    [provider]  lisdexamfetamine (VYVANSE) 60 MG capsule Take 60 mg by mouth every morning.     [provider]  Multiple Vitamins-Minerals (BARIATRIC MULTIVITAMINS/IRON) CAPS Take 1 capsule by mouth 2 (two) times a week.     [provider]  predniSONE (STERAPRED UNI-PAK 21 TAB) 10 MG (21) TBPK tablet Take 6 pills on day one then decrease by 1 pill each day 08/16/19   Faythe Ghee, PA-C  traMADol (ULTRAM) 50 MG tablet Take 1  tablet (50 mg total) by mouth every 6 (six) hours as needed. 08/16/19   Faythe Ghee, PA-C     Allergies Nsaids  Family History  Problem Relation Age of Onset  . Mental illness Mother   . Heart disease Father   . Thyroid disease Father   . Diabetes Paternal Grandmother   . Ovarian cancer Maternal Aunt   . Ovarian cancer Cousin   . Thyroid cancer Cousin   . Ovarian cancer Maternal Aunt   . Ovarian cancer Maternal Aunt   . Breast cancer Neg Hx   . Colon cancer Neg Hx     Social History Social History   Tobacco Use  . Smoking status: Current Every Day Smoker    Types: Cigarettes  . Smokeless tobacco: Never Used  . Tobacco comment: 8 cigarretes a day  Vaping Use  . Vaping Use: Never used   Substance Use Topics  . Alcohol use: Yes    Alcohol/week: 0.0 standard drinks    Comment: occas  . Drug use: Yes    Types: Marijuana    Comment: "every now and then"    Review of Systems  Constitutional: No fever/chills Eyes: No visual changes.  ENT: No sore throat. Cardiovascular: no chest pain Respiratory: As above Gastrointestinal: No abdominal pain.  No nausea, no vomiting.   Genitourinary: Negative for dysuria. Musculoskeletal: As above. Skin: Negative for rash. Neurological: Negative for headaches or weakness   ____________________________________________   PHYSICAL EXAM:  VITAL SIGNS: ED Triage Vitals  Enc Vitals Group     BP 05/01/20 1024 103/68     Pulse Rate 05/01/20 1024 86     Resp 05/01/20 1024 16     Temp 05/01/20 1024 98.6 F (37 C)     Temp Source 05/01/20 1024 Oral     SpO2 05/01/20 1024 98 %     Weight 05/01/20 1005 72.6 kg (160 lb)     Height 05/01/20 1005 1.575 m (5\' 2" )     Head Circumference --      Peak Flow --      Pain Score 05/01/20 1005 0     Pain Loc --      Pain Edu? --      Excl. in GC? --     Constitutional: Alert and oriented. No acute distress.   Nose: No congestion/rhinnorhea. Mouth/Throat: Mucous membranes are moist.   Neck:  Painless ROM Cardiovascular: Normal rate, regular rhythm. 05/03/20 peripheral circulation. Respiratory: Normal respiratory effort.  No retractions.    Musculoskeletal: Mild tenderness to palpation of the posterior superior calf on the left, no significant swelling, extremity is warm and well perfused Neurologic:  Normal speech and language. No gross focal neurologic deficits are appreciated.  Skin:  Skin is warm, dry and intact. No rash noted. Psychiatric: Mood and affect are normal. Speech and behavior are normal.  ____________________________________________   LABS (all labs ordered are listed, but only abnormal results are displayed)  Labs Reviewed - No data to  display ____________________________________________  EKG  None ____________________________________________  RADIOLOGY  Ultrasound reviewed by me, no DVT, confirmed by radiology ____________________________________________   PROCEDURES  Procedure(s) performed: No  Procedures   Critical Care performed: No ____________________________________________   INITIAL IMPRESSION / ASSESSMENT AND PLAN / ED COURSE  Pertinent labs & imaging results that were available during my care of the patient were reviewed by me and considered in my medical decision making (see chart for details).  Patient presents with left calf  pain as described above.  Differential includes muscle strain, Baker's cyst, DVT  Exam is most consistent for muscle strain, will obtain DVT though given recent history.  Ultrasound obtained which demonstrates no DVT or Baker's cyst.  Consistent with muscle injury.  Recommend supportive care, outpatient follow-up as needed.    ____________________________________________   FINAL CLINICAL IMPRESSION(S) / ED DIAGNOSES  Final diagnoses:  Muscle strain  Left leg pain        Note:  This document was prepared using Dragon voice recognition software and may include unintentional dictation errors.   Jene Every, MD 05/01/20 1210

## 2020-08-19 IMAGING — CT CT HEAD W/O CM
3 series · 16 of 46 positions shown, 19 images · non-contrast
Comparison: April 21, 2010.

CLINICAL DATA: Headache.

EXAM:
CT HEAD WITHOUT CONTRAST
TECHNIQUE: Contiguous axial images were obtained from the base of the skull
through the vertex without intravenous contrast.

[Series 2: head wo · axial · 0.41mm/px · z∈[-136,-16]mm · 10 of 29 slices shown, 13 images]
[im 3/29  brain]
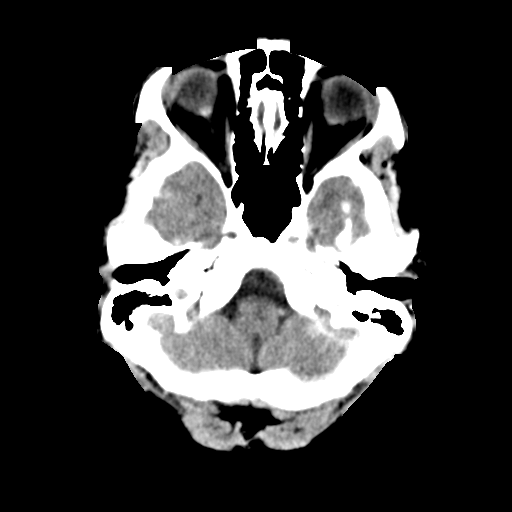
[im 3/29  bone]
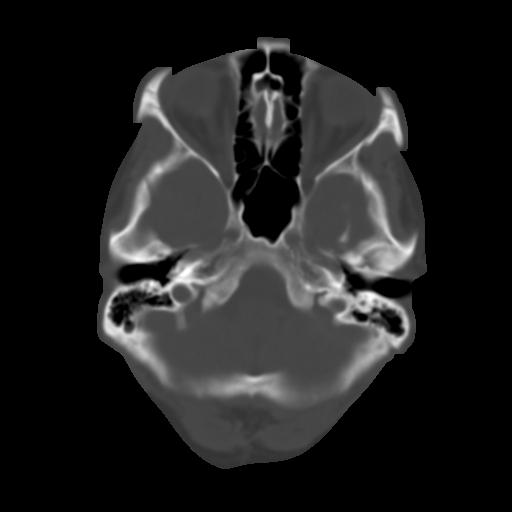
[im 6/29  brain]
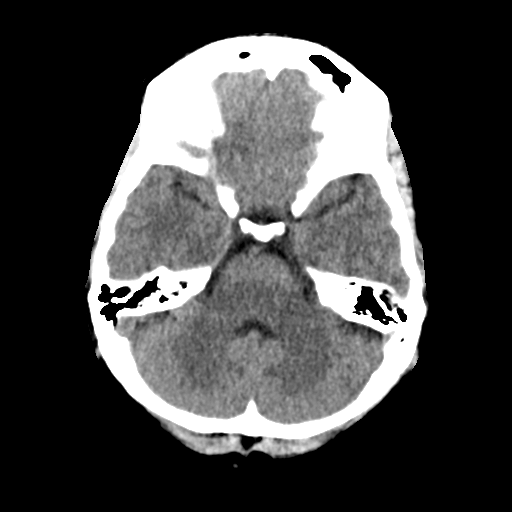
[im 8/29  brain]
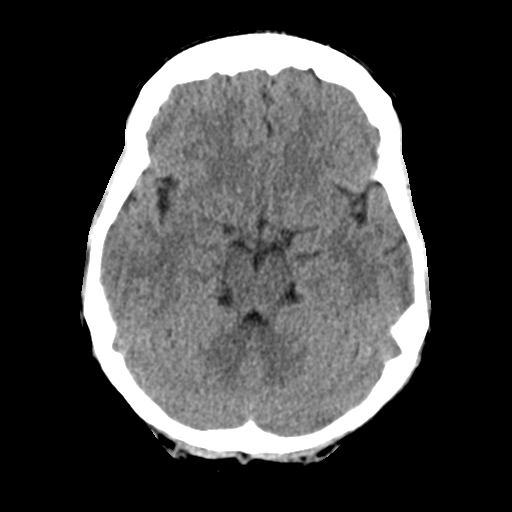
[im 11/29  brain]
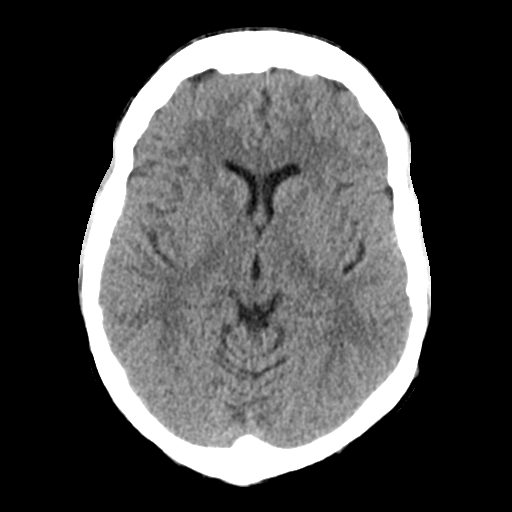
[im 14/29  brain]
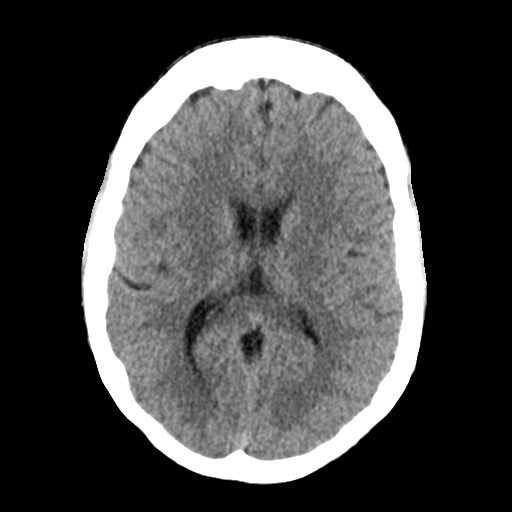
[im 14/29  bone]
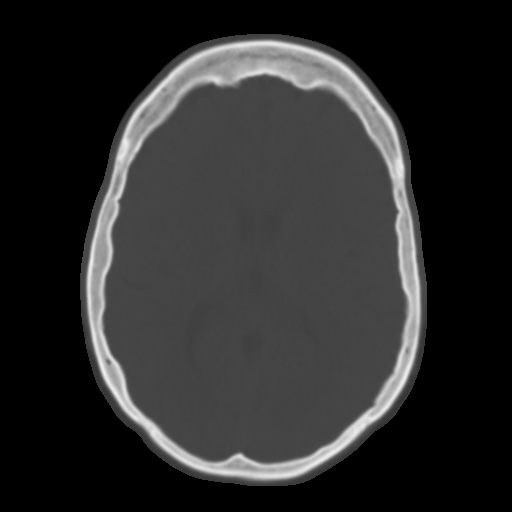
[im 16/29  brain]
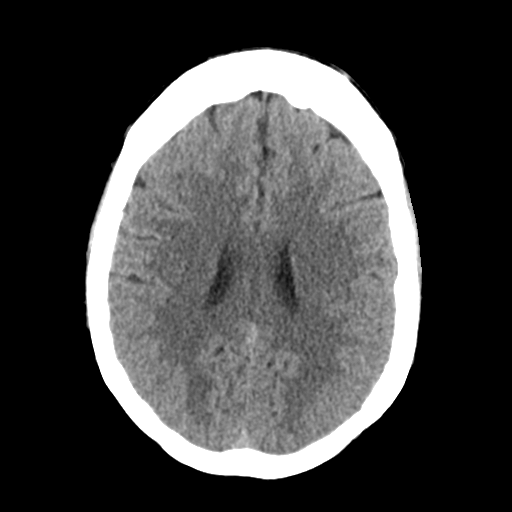
[im 19/29  brain]
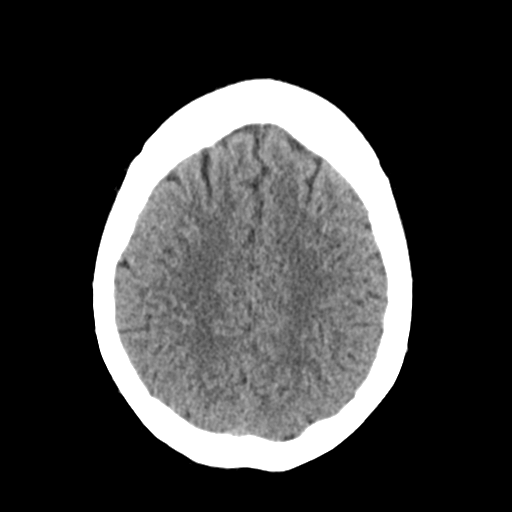
[im 22/29  brain]
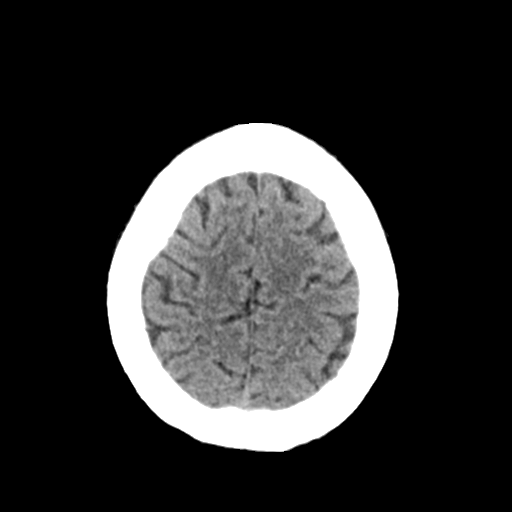
[im 24/29  brain]
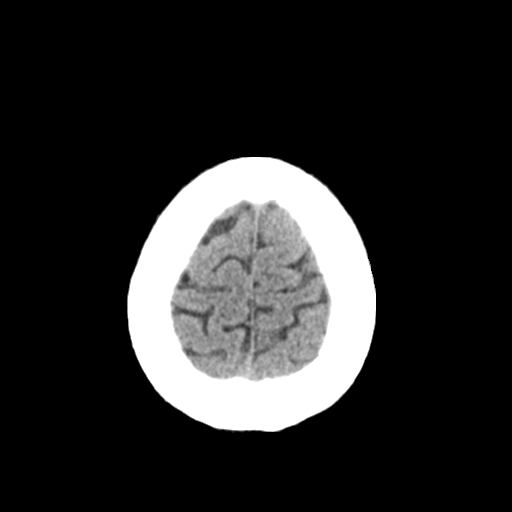
[im 24/29  bone]
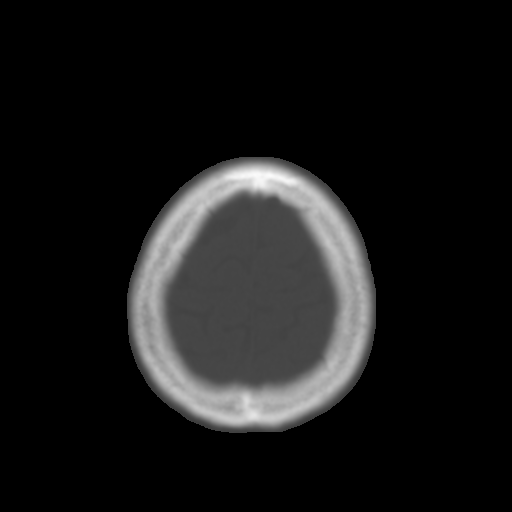
[im 27/29  brain]
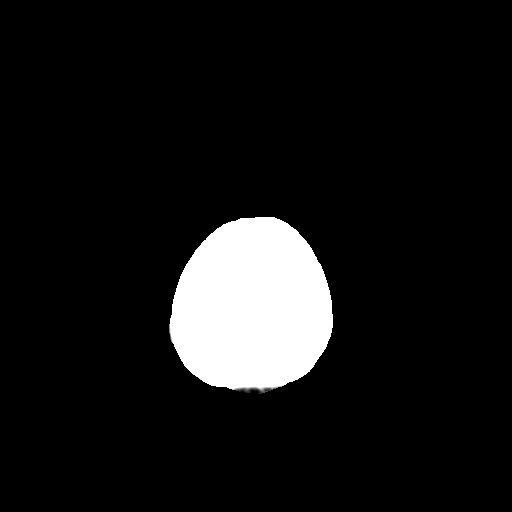

[Series 4: coronal soft tissue · coronal · 0.29mm/px · 3 of 64 slices shown]
[im 22/64  brain]
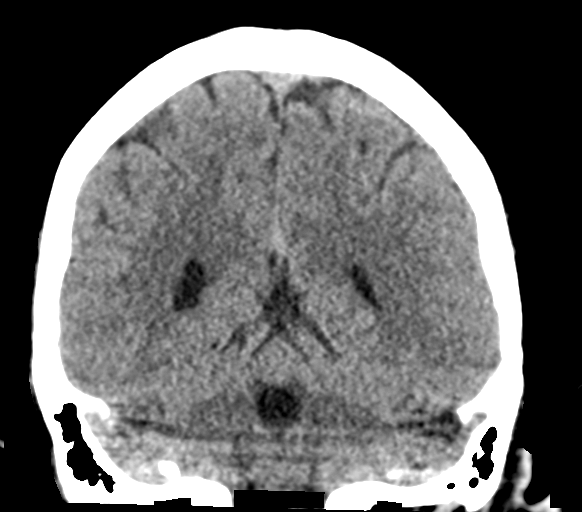
[im 29/64  brain]
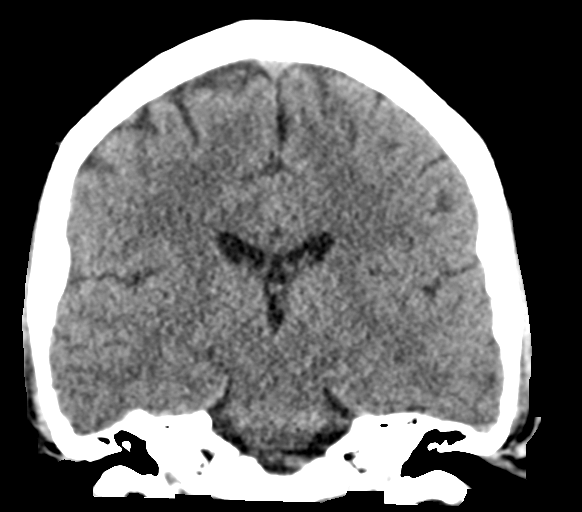
[im 36/64  brain]
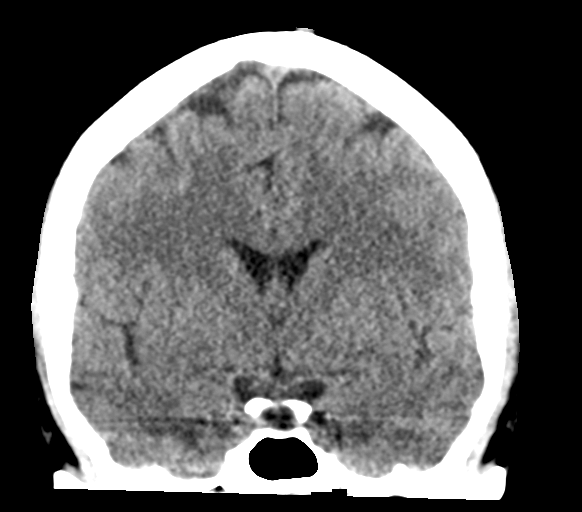

[Series 5: sagittal soft tissue · sagittal · 0.29mm/px · 3 of 53 slices shown]
[im 18/53  brain]
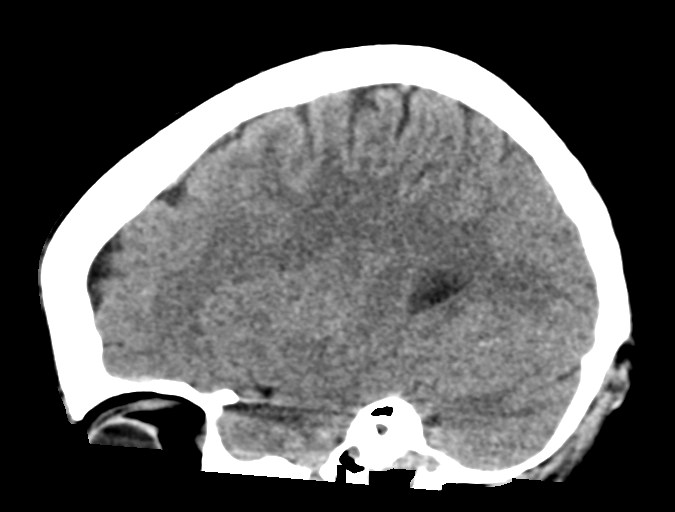
[im 27/53  brain]
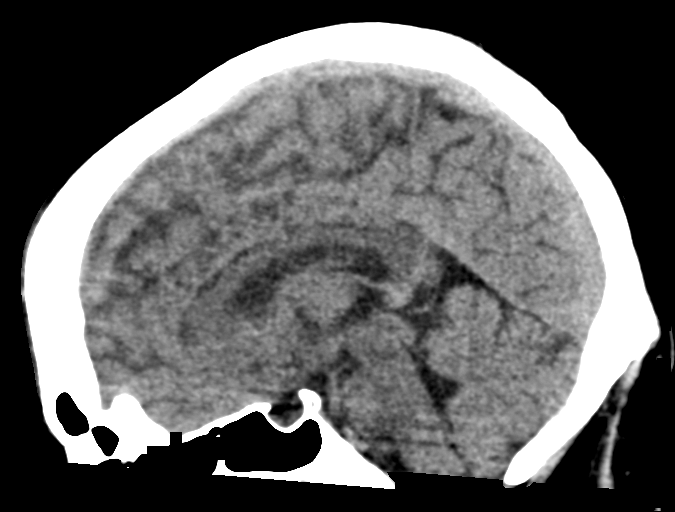
[im 35/53  brain]
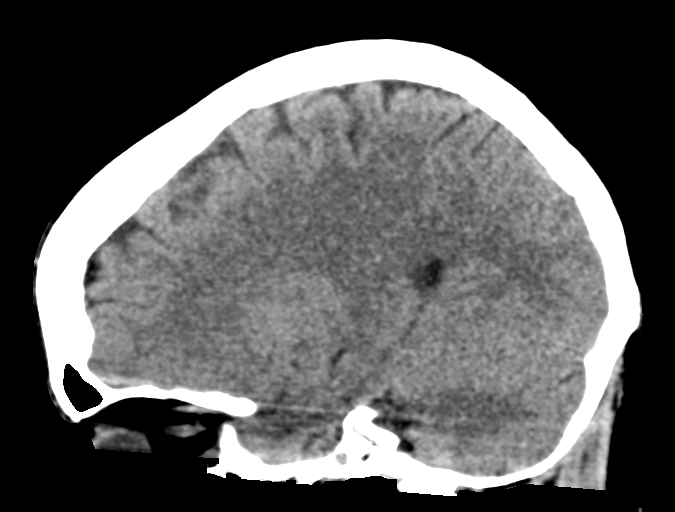

[16 of 46 positions shown; findings below may reference images not displayed]

FINDINGS: Brain: No evidence of acute infarction, hemorrhage, hydrocephalus,
extra-axial collection or mass lesion/mass effect.

Vascular: No hyperdense vessel or unexpected calcification.

Skull: Normal. Negative for fracture or focal lesion.

Sinuses/Orbits: No acute finding.

Other: None.
IMPRESSION: Normal head CT.

## 2020-08-25 ENCOUNTER — Ambulatory Visit (INDEPENDENT_AMBULATORY_CARE_PROVIDER_SITE_OTHER): Payer: Medicaid Other | Admitting: Dermatology

## 2020-08-25 ENCOUNTER — Encounter: Payer: Self-pay | Admitting: Dermatology

## 2020-08-25 ENCOUNTER — Other Ambulatory Visit: Payer: Self-pay

## 2020-08-25 DIAGNOSIS — L02213 Cutaneous abscess of chest wall: Secondary | ICD-10-CM

## 2020-08-25 DIAGNOSIS — L03313 Cellulitis of chest wall: Secondary | ICD-10-CM | POA: Diagnosis not present

## 2020-08-25 DIAGNOSIS — L0291 Cutaneous abscess, unspecified: Secondary | ICD-10-CM

## 2020-08-25 MED ORDER — MUPIROCIN 2 % EX OINT: TOPICAL_OINTMENT | CUTANEOUS | 0 refills | Status: DC

## 2020-08-25 MED ORDER — DOXYCYCLINE HYCLATE 100 MG PO TABS: ORAL_TABLET | ORAL | 0 refills | Status: DC

## 2020-08-25 NOTE — Progress Notes (Signed)
   New Patient Visit  Subjective  Melanie Nicholson is a 45 y.o. female who presents for the following: abscess (Chest - has been there for a long time, but recently became, painful, inflamed, and larger in size over the past 2-3 weeks. ).  The following portions of the chart were reviewed this encounter and updated as appropriate:   Tobacco  Allergies  Meds  Problems  Med Hx  Surg Hx  Fam Hx     Review of Systems:  No other skin or systemic complaints except as noted in HPI or Assessment and Plan.  Objective  Well appearing patient in no apparent distress; mood and affect are within normal limits.  A focused examination was performed including the chest . Relevant physical exam findings are noted in the Assessment and Plan.  Objective  L chest: 3.0 cm red, tender nodule.  Assessment & Plan  Abscess secondary to inflamed ruptured cyst With associated cellulitis and pain L chest Start Doxycycline 100mg  po BID x 10 days and Mupirocin 2% ointment BID.   Incision and Drainage - L chest Location: L chest   Informed Consent: Discussed risks (permanent scarring, light or dark discoloration, infection, pain, bleeding, bruising, redness, damage to adjacent structures, and recurrence of the lesion) and benefits of the procedure, as well as the alternatives.  Informed consent was obtained.  Preparation: The area was prepped with alcohol.  Anesthesia: Lidocaine 1% with epinephrine  Procedure Details: An incision was made overlying the lesion. The lesion drained pus, white, chalky cyst material, and blood.  A small amount of fluid was drained.    Antibiotic ointment and a sterile pressure dressing were applied. The patient tolerated procedure well.  Total number of lesions drained: 1  Plan: The patient was instructed on post-op care. Recommend OTC analgesia as needed for pain.  doxycycline (VIBRA-TABS) 100 MG tablet - L chest  mupirocin ointment (BACTROBAN) 2 % - L  chest  Return for abscess recheck in 2-3 months.  , CMA, am acting as scribe for [name], MD .  Documentation: I have reviewed the above documentation for accuracy and completeness, and I agree with the above.  Armida Sans, MD

## 2020-11-04 ENCOUNTER — Other Ambulatory Visit: Payer: Self-pay

## 2020-11-04 ENCOUNTER — Emergency Department
Admission: EM | Admit: 2020-11-04 | Discharge: 2020-11-05 | Disposition: A | Discharge status: Other Acute Inpatient Hospital | Payer: Medicaid Other | Attending: Emergency Medicine | Admitting: Emergency Medicine

## 2020-11-04 DIAGNOSIS — J449 Chronic obstructive pulmonary disease, unspecified: Secondary | ICD-10-CM | POA: Insufficient documentation

## 2020-11-04 DIAGNOSIS — Z20822 Contact with and (suspected) exposure to covid-19: Secondary | ICD-10-CM | POA: Diagnosis not present

## 2020-11-04 DIAGNOSIS — T50902A Poisoning by unspecified drugs, medicaments and biological substances, intentional self-harm, initial encounter: Secondary | ICD-10-CM | POA: Insufficient documentation

## 2020-11-04 DIAGNOSIS — F988 Other specified behavioral and emotional disorders with onset usually occurring in childhood and adolescence: Secondary | ICD-10-CM | POA: Diagnosis present

## 2020-11-04 DIAGNOSIS — F411 Generalized anxiety disorder: Secondary | ICD-10-CM | POA: Diagnosis present

## 2020-11-04 DIAGNOSIS — F259 Schizoaffective disorder, unspecified: Secondary | ICD-10-CM | POA: Diagnosis present

## 2020-11-04 DIAGNOSIS — Z79899 Other long term (current) drug therapy: Secondary | ICD-10-CM | POA: Diagnosis not present

## 2020-11-04 DIAGNOSIS — F319 Bipolar disorder, unspecified: Secondary | ICD-10-CM | POA: Diagnosis present

## 2020-11-04 DIAGNOSIS — E039 Hypothyroidism, unspecified: Secondary | ICD-10-CM | POA: Insufficient documentation

## 2020-11-04 DIAGNOSIS — J45909 Unspecified asthma, uncomplicated: Secondary | ICD-10-CM | POA: Diagnosis not present

## 2020-11-04 DIAGNOSIS — F332 Major depressive disorder, recurrent severe without psychotic features: Secondary | ICD-10-CM | POA: Diagnosis not present

## 2020-11-04 DIAGNOSIS — X58XXXA Exposure to other specified factors, initial encounter: Secondary | ICD-10-CM | POA: Insufficient documentation

## 2020-11-04 DIAGNOSIS — F17210 Nicotine dependence, cigarettes, uncomplicated: Secondary | ICD-10-CM | POA: Insufficient documentation

## 2020-11-04 DIAGNOSIS — F429 Obsessive-compulsive disorder, unspecified: Secondary | ICD-10-CM | POA: Diagnosis present

## 2020-11-04 LAB — CBC
HCT: 36.2 % (ref 36.0–46.0)
Hemoglobin: 11.9 g/dL — ABNORMAL LOW (ref 12.0–15.0)
MCH: 30.1 pg (ref 26.0–34.0)
MCHC: 32.9 g/dL (ref 30.0–36.0)
MCV: 91.6 fL (ref 80.0–100.0)
Platelets: 157 10*3/uL (ref 150–400)
RBC: 3.95 MIL/uL (ref 3.87–5.11)
RDW: 13.2 % (ref 11.5–15.5)
WBC: 4.2 10*3/uL (ref 4.0–10.5)
nRBC: 0 % (ref 0.0–0.2)

## 2020-11-04 LAB — COMPREHENSIVE METABOLIC PANEL
ALT: 14 U/L (ref 0–44)
AST: 21 U/L (ref 15–41)
Albumin: 3.6 g/dL (ref 3.5–5.0)
Alkaline Phosphatase: 27 U/L — ABNORMAL LOW (ref 38–126)
Anion gap: 6 (ref 5–15)
BUN: 11 mg/dL (ref 6–20)
CO2: 27 mmol/L (ref 22–32)
Calcium: 8.8 mg/dL — ABNORMAL LOW (ref 8.9–10.3)
Chloride: 108 mmol/L (ref 98–111)
Creatinine, Ser: 0.73 mg/dL (ref 0.44–1.00)
GFR, Estimated: >60 mL/min (ref >60)
Glucose, Bld: 86 mg/dL (ref 70–99)
Potassium: 3.8 mmol/L (ref 3.5–5.1)
Sodium: 141 mmol/L (ref 135–145)
Total Bilirubin: 0.9 mg/dL (ref 0.3–1.2)
Total Protein: 6 g/dL — ABNORMAL LOW (ref 6.5–8.1)

## 2020-11-04 LAB — URINE DRUG SCREEN, QUALITATIVE (ARMC ONLY)
Amphetamines, Ur Screen: POSITIVE — AB
Barbiturates, Ur Screen: NOT DETECTED
Benzodiazepine, Ur Scrn: POSITIVE — AB
Cannabinoid 50 Ng, Ur ~~LOC~~: POSITIVE — AB
Cocaine Metabolite,Ur ~~LOC~~: NOT DETECTED
MDMA (Ecstasy)Ur Screen: NOT DETECTED
Methadone Scn, Ur: NOT DETECTED
Opiate, Ur Screen: NOT DETECTED
Phencyclidine (PCP) Ur S: NOT DETECTED
Tricyclic, Ur Screen: NOT DETECTED

## 2020-11-04 LAB — POC URINE PREG, ED: Preg Test, Ur: NEGATIVE

## 2020-11-04 LAB — ACETAMINOPHEN LEVEL: Acetaminophen (Tylenol), Serum: <10 ug/mL — ABNORMAL LOW (ref 10–30)

## 2020-11-04 LAB — RESP PANEL BY RT-PCR (FLU A&B, COVID) ARPGX2
Influenza A by PCR: NEGATIVE
Influenza B by PCR: NEGATIVE
SARS Coronavirus 2 by RT PCR: NEGATIVE

## 2020-11-04 LAB — SALICYLATE LEVEL: Salicylate Lvl: <7 mg/dL — ABNORMAL LOW (ref 7.0–30.0)

## 2020-11-04 LAB — ETHANOL: Alcohol, Ethyl (B): <10 mg/dL (ref <10)

## 2020-11-04 MED ORDER — ONDANSETRON HCL 4 MG PO TABS: 4.0000 mg | ORAL_TABLET | Freq: Three times a day (TID) | ORAL | Status: DC | PRN

## 2020-11-04 MED ORDER — ALUM & MAG HYDROXIDE-SIMETH 200-200-20 MG/5ML PO SUSP: 30.0000 mL | Freq: Four times a day (QID) | ORAL | Status: DC | PRN

## 2020-11-04 MED ORDER — NICOTINE 21 MG/24HR TD PT24
21.0000 mg | MEDICATED_PATCH | Freq: Every day | TRANSDERMAL | Status: DC
  Administered 2020-11-04 – 2020-11-05 (×2): 21 mg via TRANSDERMAL
  Filled 2020-11-04 (×2): qty 1

## 2020-11-04 NOTE — BH Assessment (Signed)
TTS spoke with patient's daughter, [name] for collateral. [name] states she thinks patient may have taken Ambien twice today. [name] reports patient was unsure if she had taken it before going to breakfast and took it again afterwards. [name] states she does not think this was a suicide attempt but merely a mistake. [name] reports patient has been with her for the past 2 weeks and there hasn't been any issues or concerns that she is aware of.   [name]

## 2020-11-04 NOTE — ED Notes (Signed)
Pt. Transferred to BHU from ED to room after screening for contraband. Report to include Situation, Background, Assessment and Recommendations from [facility]. Pt. Oriented to unit including Q15 minute rounds as well as the security cameras for their protection. Patient is alert and oriented, warm and dry in no acute distress. Patient denies SI, HI, and AVH. Pt. Encouraged to let me know if needs arise.
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
## 2020-11-04 NOTE — ED Notes (Signed)
Psych ED NP Provider at bedside.

## 2020-11-04 NOTE — ED Notes (Signed)
Patient ambulatory to restroom to provide urine sample, standby assist.

## 2020-11-04 NOTE — BH Assessment (Signed)
TTS attempted to wake patient. Patient was drowsy and would not open her eyes. Writer asked nurse to inform if patient wakes prior to end of shift.

## 2020-11-04 NOTE — ED Notes (Signed)
IVC, pend psych consult, no SOC needed per Dr Scotty Court

## 2020-11-04 NOTE — ED Notes (Signed)
Dinner tray given
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
## 2020-11-04 NOTE — ED Notes (Signed)
TTS came to eval patient, but patient too drowsy. Asked to call back if patient is more alert before 7pm, otherwise night shift TTS will reattempt eval.
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
## 2020-11-04 NOTE — ED Notes (Signed)
Patient now awake and eating meal tray. TTS notified.

## 2020-11-04 NOTE — ED Notes (Signed)
Patient password entered in flowsheet. Daughter provided with unit information sheet and updated on POC.

## 2020-11-05 NOTE — ED Notes (Signed)
Hourly rounding reveals patient in room. No complaints, stable, in no acute distress. Q15 minute rounds and monitoring via Security Cameras to continue. 

## 2020-11-05 NOTE — ED Notes (Signed)
Report received care of pt assumed.  VS deferred at this time as pt still sleeping, will obtain when pt wakes.

## 2020-11-05 NOTE — ED Provider Notes (Signed)
Emergency Medicine Observation Re-evaluation Note  Melanie Nicholson is a 45 y.o. female, seen on rounds today.  Pt initially presented to the ED for complaints of Ingestion  Currently, the patient is is no acute distress sleeping.   Physical Exam  Blood pressure 99/69, pulse (!) 58, temperature 97.8 F (36.6 C), temperature source Oral, resp. rate 19, height 5\' 2"  (1.575 m), weight 72.6 kg, SpO2 99 %.  Physical Exam General: No apparent distress HEENT: moist mucous membranes CV: RRR Pulm: Normal WOB GI: soft and non tender MSK: no edema or cyanosis Neuro: face symmetric, moving all extremities     ED Course / MDM     I have reviewed the labs performed to date as well as medications administered while in observation.  Recent changes in the last 24 hours include none   Plan   Current plan [facility].  Patient is under full IVC at this time.   , MD 11/05/20 [phone]

## 2020-11-05 NOTE — ED Notes (Signed)
VS not taken, Patient asleep. 

## 2020-11-05 NOTE — BH Assessment (Signed)
PATIENT BED AVAILABLE AFTER 9AM ON 11/05/20  Patient has been accepted to [facility].  Patient assigned to [facility] Accepting physician is Dr. Otho Perl.  Call report to [phone].  Representative was [employer].   ER Staff is aware of it:  [facility] ER Secretary  Dr. York Cerise, ER MD  [facility] Patient's Nurse     Address: [address] [place], [place] [address]

## 2020-11-05 NOTE — ED Notes (Signed)
IVC Called [facility] for transport to [facility]  0805

## 2020-11-05 NOTE — ED Notes (Signed)
Attempted to call report to [facility] x 2 with no answer, will attempt again.  Pt remains asleep at this time, VS deferred

## 2020-11-05 NOTE — BH Assessment (Signed)
Referral information for Psychiatric Hospitalization faxed to;   [facility] [phone]),   . [facility] [phone]),    . [facility] [phone] -or- [phone]),    . [facility] [phone]).

## 2020-11-05 NOTE — ED Notes (Signed)
IVC/ Consult Completed/ Accepted to [facility] after 9 am

## 2020-11-15 ENCOUNTER — Ambulatory Visit: Payer: Medicaid Other | Admitting: Dermatology

## 2021-05-27 ENCOUNTER — Other Ambulatory Visit: Payer: Self-pay | Admitting: Adult Health

## 2021-05-27 DIAGNOSIS — N63 Unspecified lump in unspecified breast: Secondary | ICD-10-CM

## 2021-07-14 ENCOUNTER — Encounter: Payer: Self-pay | Admitting: Oncology

## 2021-07-21 ENCOUNTER — Other Ambulatory Visit: Payer: Self-pay

## 2021-07-21 ENCOUNTER — Ambulatory Visit
Admission: RE | Admit: 2021-07-21 | Discharge: 2021-07-21 | Disposition: A | Discharge status: Home / Self Care | Payer: Medicaid Other | Source: Ambulatory Visit | Attending: Adult Health | Admitting: Adult Health

## 2021-07-21 DIAGNOSIS — N63 Unspecified lump in unspecified breast: Secondary | ICD-10-CM

## 2021-07-21 DIAGNOSIS — N6313 Unspecified lump in the right breast, lower outer quadrant: Secondary | ICD-10-CM | POA: Insufficient documentation

## 2021-11-23 ENCOUNTER — Encounter: Payer: Self-pay | Admitting: Emergency Medicine

## 2021-11-23 ENCOUNTER — Other Ambulatory Visit: Payer: Self-pay

## 2021-11-23 ENCOUNTER — Emergency Department: Payer: Medicaid Other

## 2021-11-23 ENCOUNTER — Emergency Department
Admission: EM | Admit: 2021-11-23 | Discharge: 2021-11-23 | Disposition: A | Discharge status: Home / Self Care | Payer: Medicaid Other | Attending: Emergency Medicine | Admitting: Emergency Medicine

## 2021-11-23 DIAGNOSIS — R051 Acute cough: Secondary | ICD-10-CM | POA: Insufficient documentation

## 2021-11-23 DIAGNOSIS — R0789 Other chest pain: Secondary | ICD-10-CM | POA: Insufficient documentation

## 2021-11-23 DIAGNOSIS — R079 Chest pain, unspecified: Secondary | ICD-10-CM | POA: Diagnosis present

## 2021-11-23 LAB — BASIC METABOLIC PANEL
Anion gap: 5 (ref 5–15)
BUN: 16 mg/dL (ref 6–20)
CO2: 26 mmol/L (ref 22–32)
Calcium: 8.5 mg/dL — ABNORMAL LOW (ref 8.9–10.3)
Chloride: 106 mmol/L (ref 98–111)
Creatinine, Ser: 1.02 mg/dL — ABNORMAL HIGH (ref 0.44–1.00)
GFR, Estimated: >60 mL/min (ref >60)
Glucose, Bld: 102 mg/dL — ABNORMAL HIGH (ref 70–99)
Potassium: 4.1 mmol/L (ref 3.5–5.1)
Sodium: 137 mmol/L (ref 135–145)

## 2021-11-23 LAB — LIPASE, BLOOD: Lipase: 42 U/L (ref 11–51)

## 2021-11-23 LAB — HEPATIC FUNCTION PANEL
ALT: 31 U/L (ref 0–44)
AST: 86 U/L — ABNORMAL HIGH (ref 15–41)
Albumin: 3.9 g/dL (ref 3.5–5.0)
Alkaline Phosphatase: 34 U/L — ABNORMAL LOW (ref 38–126)
Bilirubin, Direct: 0.1 mg/dL (ref 0.0–0.2)
Indirect Bilirubin: 0.5 mg/dL (ref 0.3–0.9)
Total Bilirubin: 0.6 mg/dL (ref 0.3–1.2)
Total Protein: 6.6 g/dL (ref 6.5–8.1)

## 2021-11-23 LAB — CBC
HCT: 36.2 % (ref 36.0–46.0)
Hemoglobin: 11.8 g/dL — ABNORMAL LOW (ref 12.0–15.0)
MCH: 28.7 pg (ref 26.0–34.0)
MCHC: 32.6 g/dL (ref 30.0–36.0)
MCV: 88.1 fL (ref 80.0–100.0)
Platelets: 190 10*3/uL (ref 150–400)
RBC: 4.11 MIL/uL (ref 3.87–5.11)
RDW: 13.8 % (ref 11.5–15.5)
WBC: 5.6 10*3/uL (ref 4.0–10.5)
nRBC: 0 % (ref 0.0–0.2)

## 2021-11-23 LAB — D-DIMER, QUANTITATIVE: D-Dimer, Quant: 0.27 ug/mL-FEU (ref 0.00–0.50)

## 2021-11-23 LAB — TROPONIN I (HIGH SENSITIVITY)
Troponin I (High Sensitivity): <2 ng/L (ref <18)
Troponin I (High Sensitivity): <2 ng/L (ref <18)

## 2021-11-23 LAB — POC URINE PREG, ED: Preg Test, Ur: NEGATIVE

## 2021-11-23 MED ORDER — LIDOCAINE 5 % EX PTCH
1.0000 | MEDICATED_PATCH | Freq: Two times a day (BID) | CUTANEOUS | 0 refills | Status: AC
Start: 2021-11-23 — End: 2021-11-28

## 2021-11-23 MED ORDER — MORPHINE SULFATE (PF) 2 MG/ML IV SOLN
2.0000 mg | Freq: Once | INTRAVENOUS | Status: AC
  Administered 2021-11-23: 2 mg via INTRAVENOUS
  Filled 2021-11-23: qty 1

## 2021-11-23 MED ORDER — ONDANSETRON HCL 4 MG/2ML IJ SOLN
4.0000 mg | Freq: Once | INTRAMUSCULAR | Status: AC
  Administered 2021-11-23: 4 mg via INTRAVENOUS
  Filled 2021-11-23: qty 2

## 2021-11-23 MED ORDER — BENZONATATE 100 MG PO CAPS: 100.0000 mg | ORAL_CAPSULE | Freq: Two times a day (BID) | ORAL | 0 refills | Status: DC | PRN

## 2021-11-23 MED ORDER — ACETAMINOPHEN 325 MG PO TABS
650.0000 mg | ORAL_TABLET | Freq: Once | ORAL | Status: DC
  Filled 2021-11-23: qty 2

## 2021-11-23 NOTE — ED Notes (Signed)
See triage note  presents with a 2 week hx of chest pain  worse today  pain increases with inspiration ? ?

## 2021-11-23 NOTE — Discharge Instructions (Signed)
You may take the medications as prescribed.  Please return the emergency department for any new, worsening, or change in symptoms or other concerns including worsening chest pain, difficulty breathing, nausea, getting really sweaty, worsening pain with physical exertion, dizziness, or any other concerns.  It was a pleasure caring for you today. ?

## 2021-11-23 NOTE — ED Triage Notes (Signed)
Pt states left sided chest pain for the past week and reports getting worse since yesterday, states is sharp and hurts to breathe, reports cough but denies fever ?

## 2021-11-23 NOTE — ED Provider Notes (Signed)
? ?[facility] ?Provider Note ? ? ? Event Date/Time  ? First MD Initiated Contact with Patient 11/23/21 1843   ?  (approximate) ? ? ?History  ? ?Chest Pain ? ? ?HPI ? ?Melanie Nicholson is a 46 y.o. female with a past medical history of major depression of disorder, schizoaffective disorder, bipolar disorder, OCD, generalized anxiety disorder who presents today for evaluation of chest pain.  Patient reports that she has pain with truncal rotation or coughing/taking deep breaths.  She reports that this has been ongoing for the past 2 weeks.  She reports that it began after she started to have coughing fits which she attributes to her cigarette smoking.  She reports that she is cutting down on smoking, though still has coughing fits.  She had 3 more coughing fits today and feels like she has worsened pain because of the sweats.  She does report that she is a smoker and was coughing quite a bit 2 weeks ago and is wondering if this is the cause of her symptoms.  She denies any hemoptysis.  She denies personal or family history of PE/DVT except in her grandmother who died at age 65.  No family history of sudden cardiac death.  No calf pain or leg swelling.  No recent trips or travel.  No trauma.  She reports that her pain is improved when she sits down.  She denies any associated nausea or diaphoresis.  She denies shortness of breath.  No exogenous hormone use.  No recent travel.  No calf pain or leg swelling. ? ?Patient Active Problem List  ? Diagnosis Date Noted  ? Major depressive disorder, recurrent episode, severe (HCC) 11/04/2020  ? ADD (attention deficit disorder) 11/04/2020  ? Schizoaffective disorder (HCC) 11/04/2020  ? Bipolar disorder (HCC) 11/04/2020  ? OCD (obsessive compulsive disorder) 11/04/2020  ? Generalized anxiety disorder 11/04/2020  ? Menometrorrhagia 02/11/2019  ? Iron deficiency anemia due to chronic blood loss 09/11/2018  ? History of cesarean section 03/02/2016  ? History of  ectopic pregnancy 03/02/2016  ? History of cholecystectomy 03/02/2016  ? History of gastric bypass 03/02/2016  ? ?  ? ?  ? ? ?Physical Exam  ? ?Triage Vital Signs: ?ED Triage Vitals  ?Enc Vitals Group  ?   BP 11/23/21 1659 (!) 120/91  ?   Pulse Rate 11/23/21 1659 91  ?   Resp 11/23/21 1659 18  ?   Temp 11/23/21 1659 98.1 ?F (36.7 ?C)  ?   Temp Source 11/23/21 1659 Oral  ?   SpO2 11/23/21 1659 98 %  ?   Weight 11/23/21 1701 160 lb (72.6 kg)  ?   Height 11/23/21 1701 5\' 2"  (1.575 m)  ?   Head Circumference --   ?   Peak Flow --   ?   Pain Score 11/23/21 1700 10  ?   Pain Loc --   ?   Pain Edu? --   ?   Excl. in [place]? --   ? ? ?Most recent vital signs: ?Vitals:  ? 11/23/21 1659 11/23/21 1938  ?BP: (!) 120/91 118/89  ?Pulse: 91 74  ?Resp: 18 20  ?Temp: 98.1 ?F (36.7 ?C)   ?SpO2: 98% 99%  ? ? ?Physical Exam ?Vitals and nursing note reviewed.  ?Constitutional:   ?   General: Awake and alert. No acute distress. ?   Appearance: Normal appearance. She is well-developed and normal weight.  ?HENT:  ?   Head: Normocephalic and atraumatic.  ?  Mouth: Mucous membranes are moist.  ?Eyes:  ?   General: PERRL. Normal EOMs     ?   Right eye: No discharge.     ?   Left eye: No discharge.  ?   Conjunctiva/sclera: Conjunctivae normal.  ?Cardiovascular:  ?   Rate and Rhythm: Normal rate and regular rhythm.  ?   Pulses: Normal pulses.  ?   Heart sounds: Normal heart sounds ?Pulmonary:  ?   Effort: Pulmonary effort is normal. No respiratory distress.  ?   Breath sounds: Normal breath sounds.  ?Abdominal:  ?   Abdomen is soft. There is no abdominal tenderness. No rebound or guarding. No distention. ?Musculoskeletal:     ?   General: No swelling. Normal range of motion.  ?   Cervical back: Normal range of motion and neck supple.  ?Lymphadenopathy:  ?   Cervical: No cervical adenopathy.  ?Skin: ?   General: Skin is warm and dry.  ?   Capillary Refill: Capillary refill takes less than 2 seconds.  ?   Findings: No rash.  ?Neurological:  ?    Mental Status: He is alert.  ?  ? ? ?ED Results / Procedures / Treatments  ? ?Labs ?(all labs ordered are listed, but only abnormal results are displayed) ?Labs Reviewed  ?BASIC METABOLIC PANEL - Abnormal; Notable for the following components:  ?    Result Value  ? Glucose, Bld 102 (*)   ? Creatinine, Ser 1.02 (*)   ? Calcium 8.5 (*)   ? All other components within normal limits  ?CBC - Abnormal; Notable for the following components:  ? Hemoglobin 11.8 (*)   ? All other components within normal limits  ?HEPATIC FUNCTION PANEL - Abnormal; Notable for the following components:  ? AST 86 (*)   ? Alkaline Phosphatase 34 (*)   ? All other components within normal limits  ?D-DIMER, QUANTITATIVE  ?LIPASE, BLOOD  ?POC URINE PREG, ED  ?TROPONIN I (HIGH SENSITIVITY)  ?TROPONIN I (HIGH SENSITIVITY)  ? ? ? ?EKG ? ? ? ? ?RADIOLOGY ?I independently reviewed chest x-ray and agree with radiologist findings ? ? ? ?PROCEDURES: ? ?Critical Care performed:  ? ?Procedures ? ? ?MEDICATIONS ORDERED IN ED: ?Medications  ?acetaminophen (TYLENOL) tablet 650 mg (650 mg Oral Patient Refused/Not Given 11/23/21 1923)  ?morphine (PF) 2 MG/ML injection 2 mg (2 mg Intravenous Given 11/23/21 1935)  ?ondansetron [facility](ZOFRAN) injection 4 mg (4 mg Intravenous Given 11/23/21 2119)  ? ? ? ?IMPRESSION / MDM / ASSESSMENT AND PLAN / ED COURSE  ?I reviewed the triage vital signs and the nursing notes. ? ? ?Differential diagnosis includes, but is not limited to, acute coronary syndrome, pulmonary embolism, pleurisy, pneumothorax, pneumonia, costochondritis, pancreatitis, biliary colic, rib fracture.  Patient is awake and alert, hemodynamically stable and afebrile.  Her pain is worsened with palpation and position including truncal rotation, coughing, and breathing.  Labs were obtained and are reassuring with negative troponin x2, negative D-dimer.  Chest x-ray demonstrates no acute cardiopulmonary abnormality.  She has normal and equal pulses in all 4 extremities,  no radiation of pain to her back, normotensive.  Her pain is not worsened with exertion, no associated nausea, diaphoresis, EKG is nonischemic.  Heart score is 1 (point given for her risk factor of smoking).  No tachycardia or hypoxia, negative D-dimer, no clinical signs or symptoms of DVT, do not suspect pulmonary embolism.  History of having coughing fits just prior to onset as well as reproducible  pain and worsened with truncal rotation suggestive of costochondritis.  Patient continues to have coughing fits in the emergency department.  She requested a prescription for [facility] which was provided.  We discussed return precautions and the importance of close outpatient follow-up.  Patient understands and agrees with plan.  Discharged in stable condition. ? ?Clinical Course as of 11/23/21 2205  ?Wed Nov 23, 2021  ?2051 Patient reports that the morphine made her nauseous, requesting nausea meds. Pain is improved [JP]  ?  ?Clinical Course User Index ?[JP] Kerria Sapien, Herb Grays, PA-C  ? ? ? ?FINAL CLINICAL IMPRESSION(S) / ED DIAGNOSES  ? ?Final diagnoses:  ?Atypical chest pain  ?Acute cough  ? ? ? ?Rx / DC Orders  ? ?ED Discharge Orders   ? ?      Ordered  ?  lidocaine (LIDODERM) 5 %  Every 12 hours       ? 11/23/21 2131  ?  benzonatate (TESSALON PERLES) 100 MG capsule  2 times daily PRN       ? 11/23/21 2131  ? ?  ?  ? ?  ? ? ? ?Note:  This document was prepared using Dragon voice recognition software and may include unintentional dictation errors. ?  ?Jackelyn Hoehn, PA-C ?11/23/21 2205 ? ?  ?Phineas Semen, MD ?11/23/21 2211 ? ?

## 2022-10-04 ENCOUNTER — Emergency Department: Payer: Medicaid Other

## 2022-10-04 ENCOUNTER — Encounter: Payer: Self-pay | Admitting: Emergency Medicine

## 2022-10-04 ENCOUNTER — Emergency Department (HOSPITAL_COMMUNITY)
Admission: EM | Admit: 2022-10-04 | Discharge: 2022-10-04 | Disposition: A | Discharge status: Home / Self Care | Payer: Medicaid Other | Attending: Emergency Medicine | Admitting: Emergency Medicine

## 2022-10-04 ENCOUNTER — Other Ambulatory Visit: Payer: Self-pay

## 2022-10-04 ENCOUNTER — Emergency Department
Admission: EM | Admit: 2022-10-04 | Discharge: 2022-10-04 | Disposition: A | Discharge status: Home / Self Care | Payer: Medicaid Other | Attending: Emergency Medicine | Admitting: Emergency Medicine

## 2022-10-04 ENCOUNTER — Encounter (HOSPITAL_COMMUNITY): Payer: Self-pay

## 2022-10-04 DIAGNOSIS — J189 Pneumonia, unspecified organism: Secondary | ICD-10-CM

## 2022-10-04 DIAGNOSIS — N2889 Other specified disorders of kidney and ureter: Secondary | ICD-10-CM | POA: Diagnosis not present

## 2022-10-04 DIAGNOSIS — M545 Low back pain, unspecified: Secondary | ICD-10-CM

## 2022-10-04 DIAGNOSIS — R1084 Generalized abdominal pain: Secondary | ICD-10-CM | POA: Diagnosis present

## 2022-10-04 LAB — URINALYSIS, ROUTINE W REFLEX MICROSCOPIC
Bacteria, UA: NONE SEEN
Bilirubin Urine: NEGATIVE
Glucose, UA: NEGATIVE mg/dL
Ketones, ur: NEGATIVE mg/dL
Leukocytes,Ua: NEGATIVE
Nitrite: NEGATIVE
Protein, ur: NEGATIVE mg/dL
Specific Gravity, Urine: 1.006 (ref 1.005–1.030)
pH: 7 (ref 5.0–8.0)

## 2022-10-04 LAB — COMPREHENSIVE METABOLIC PANEL
ALT: 26 U/L (ref 0–44)
AST: 26 U/L (ref 15–41)
Albumin: 4.1 g/dL (ref 3.5–5.0)
Alkaline Phosphatase: 42 U/L (ref 38–126)
Anion gap: 7 (ref 5–15)
BUN: 7 mg/dL (ref 6–20)
CO2: 23 mmol/L (ref 22–32)
Calcium: 9.5 mg/dL (ref 8.9–10.3)
Chloride: 111 mmol/L (ref 98–111)
Creatinine, Ser: 0.73 mg/dL (ref 0.44–1.00)
GFR, Estimated: >60 mL/min (ref >60)
Glucose, Bld: 121 mg/dL — ABNORMAL HIGH (ref 70–99)
Potassium: 4.3 mmol/L (ref 3.5–5.1)
Sodium: 141 mmol/L (ref 135–145)
Total Bilirubin: 0.4 mg/dL (ref 0.3–1.2)
Total Protein: 7.5 g/dL (ref 6.5–8.1)

## 2022-10-04 LAB — CBC
HCT: 38.5 % (ref 36.0–46.0)
Hemoglobin: 12.8 g/dL (ref 12.0–15.0)
MCH: 28.9 pg (ref 26.0–34.0)
MCHC: 33.2 g/dL (ref 30.0–36.0)
MCV: 86.9 fL (ref 80.0–100.0)
Platelets: 219 10*3/uL (ref 150–400)
RBC: 4.43 MIL/uL (ref 3.87–5.11)
RDW: 12.8 % (ref 11.5–15.5)
WBC: 6.2 10*3/uL (ref 4.0–10.5)
nRBC: 0 % (ref 0.0–0.2)

## 2022-10-04 LAB — LIPASE, BLOOD: Lipase: 36 U/L (ref 11–51)

## 2022-10-04 LAB — PREGNANCY, URINE: Preg Test, Ur: NEGATIVE

## 2022-10-04 MED ORDER — ONDANSETRON 4 MG PO TBDP: 4.0000 mg | ORAL_TABLET | Freq: Three times a day (TID) | ORAL | 0 refills | Status: DC | PRN

## 2022-10-04 MED ORDER — METHYLPREDNISOLONE 4 MG PO TBPK: ORAL_TABLET | ORAL | 0 refills | Status: DC

## 2022-10-04 MED ORDER — KETOROLAC TROMETHAMINE 30 MG/ML IJ SOLN
15.0000 mg | Freq: Once | INTRAMUSCULAR | Status: AC
  Administered 2022-10-04: 15 mg via INTRAVENOUS
  Filled 2022-10-04: qty 1

## 2022-10-04 MED ORDER — KETOROLAC TROMETHAMINE 15 MG/ML IJ SOLN
15.0000 mg | Freq: Once | INTRAMUSCULAR | Status: AC
  Administered 2022-10-04: 15 mg via INTRAMUSCULAR
  Filled 2022-10-04: qty 1

## 2022-10-04 MED ORDER — MORPHINE SULFATE (PF) 4 MG/ML IV SOLN
4.0000 mg | Freq: Once | INTRAVENOUS | Status: AC
  Administered 2022-10-04: 4 mg via INTRAVENOUS
  Filled 2022-10-04: qty 1

## 2022-10-04 MED ORDER — DICYCLOMINE HCL 10 MG PO CAPS
20.0000 mg | ORAL_CAPSULE | Freq: Once | ORAL | Status: AC
Start: 2022-10-04 — End: 2022-10-04
  Administered 2022-10-04: 20 mg via ORAL
  Filled 2022-10-04: qty 2

## 2022-10-04 MED ORDER — PREDNISONE 20 MG PO TABS: 40.0000 mg | ORAL_TABLET | Freq: Every day | ORAL | 0 refills | Status: AC

## 2022-10-04 MED ORDER — SODIUM CHLORIDE 0.9 % IV BOLUS
1000.0000 mL | Freq: Once | INTRAVENOUS | Status: AC
  Administered 2022-10-04: 1000 mL via INTRAVENOUS

## 2022-10-04 MED ORDER — AZITHROMYCIN 250 MG PO TABS: ORAL_TABLET | ORAL | 0 refills | Status: AC

## 2022-10-04 MED ORDER — DIAZEPAM 5 MG PO TABS
5.0000 mg | ORAL_TABLET | Freq: Once | ORAL | Status: AC
  Administered 2022-10-04: 5 mg via ORAL
  Filled 2022-10-04: qty 1

## 2022-10-04 MED ORDER — AMOXICILLIN-POT CLAVULANATE 875-125 MG PO TABS: 1.0000 | ORAL_TABLET | Freq: Two times a day (BID) | ORAL | 0 refills | Status: AC

## 2022-10-04 MED ORDER — TRAMADOL HCL 50 MG PO TABS: 50.0000 mg | ORAL_TABLET | Freq: Four times a day (QID) | ORAL | 0 refills | Status: DC | PRN

## 2022-10-04 MED ORDER — IPRATROPIUM-ALBUTEROL 0.5-2.5 (3) MG/3ML IN SOLN
3.0000 mL | Freq: Once | RESPIRATORY_TRACT | Status: AC
  Administered 2022-10-04: 3 mL via RESPIRATORY_TRACT
  Filled 2022-10-04: qty 3

## 2022-10-04 MED ORDER — ALBUTEROL SULFATE HFA 108 (90 BASE) MCG/ACT IN AERS: 2.0000 | INHALATION_SPRAY | RESPIRATORY_TRACT | 1 refills | Status: DC | PRN

## 2022-10-04 MED ORDER — METHYLPREDNISOLONE SODIUM SUCC 125 MG IJ SOLR
125.0000 mg | Freq: Once | INTRAMUSCULAR | Status: AC
  Administered 2022-10-04: 125 mg via INTRAVENOUS
  Filled 2022-10-04: qty 2

## 2022-10-04 MED ORDER — TRAMADOL HCL 50 MG PO TABS
100.0000 mg | ORAL_TABLET | Freq: Once | ORAL | Status: AC
  Administered 2022-10-04: 100 mg via ORAL
  Filled 2022-10-04: qty 2

## 2022-10-04 MED ORDER — OXYCODONE HCL 5 MG PO TABS
5.0000 mg | ORAL_TABLET | Freq: Once | ORAL | Status: AC
  Administered 2022-10-04: 5 mg via ORAL
  Filled 2022-10-04: qty 1

## 2022-10-04 MED ORDER — DICLOFENAC SODIUM 1 % EX GEL: 4.0000 g | Freq: Four times a day (QID) | CUTANEOUS | 0 refills | Status: DC

## 2022-10-04 MED ORDER — SODIUM CHLORIDE 0.9 % IV SOLN: 500.0000 mg | Freq: Once | INTRAVENOUS | Status: DC

## 2022-10-04 MED ORDER — ACETAMINOPHEN 500 MG PO TABS
1000.0000 mg | ORAL_TABLET | Freq: Once | ORAL | Status: AC
  Administered 2022-10-04: 1000 mg via ORAL
  Filled 2022-10-04: qty 2

## 2022-10-04 MED ORDER — SODIUM CHLORIDE 0.9 % IV SOLN
2.0000 g | Freq: Once | INTRAVENOUS | Status: AC
  Administered 2022-10-04: 2 g via INTRAVENOUS
  Filled 2022-10-04: qty 20

## 2022-10-04 NOTE — ED Notes (Signed)
Pt taken to ct via stretcher with the ct tech

## 2022-10-04 NOTE — Discharge Instructions (Addendum)
Use the albuterol inhaler every 4-6 hours for the next 2 to 3 days.  Take the antibiotics as prescribed.  Take over-the-counter Tylenol as well as the tramadol for severe pain.  The naltrexone should wear out of your system within the next 12 hours or so at which time your pain should be improved.  You may continue to have some back pain from the pneumonia but this should also improve with the medications.  OF NOTE, your CT did show an incidental spot on your kidney - this should be followed up in 3-6 MONTHS with a repeat scan. Please discuss with your primary doctor

## 2022-10-04 NOTE — ED Provider Notes (Signed)
[facility] Provider Note    Event Date/Time   First MD Initiated Contact with Patient 10/04/22 [phone]     (approximate)   History   Abdominal Pain   HPI  Melanie Nicholson is a 47 y.o. female  here with abdominal pain and cramping. Pt reports that for the past 24 hours she has had aching, throbbing, generalized abd pain. It started after she had taken a full naltrexone (on it for weight loss per her report) instead of a quarter accidentally. She's had back and abd pain since then that is severe. No vomiting, no diarrhea. Ho gastric bypass. No fevers. No urinary sx. She is on her period, denies any overt blood in her urine.       Physical Exam   Triage Vital Signs: ED Triage Vitals  Enc Vitals Group     BP 10/04/22 0652 (!) 138/93     Pulse Rate 10/04/22 0652 (!) 103     Resp 10/04/22 0652 20     Temp 10/04/22 0652 97.9 F (36.6 C)     Temp Source 10/04/22 0652 Oral     SpO2 10/04/22 0652 95 %     Weight 10/04/22 0651 161 lb 6 oz (73.2 kg)     Height 10/04/22 0651 5\' 2"  (1.575 m)     Head Circumference --      Peak Flow --      Pain Score 10/04/22 0651 10     Pain Loc --      Pain Edu? --      Excl. in [place]? --     Most recent vital signs: Vitals:   10/04/22 0652 10/04/22 0858  BP: (!) 138/93 136/88  Pulse: (!) 103 95  Resp: 20 18  Temp: 97.9 F (36.6 C) 97.7 F (36.5 C)  SpO2: 95% 100%     General: Awake, no distress.  CV:  Good peripheral perfusion.  Resp:  Normal work of breathing. Lungs with mild wheezing. Abd:  No distention. Moderate diffuse TTP, no rebound or guarding. Mild paraspinal TTP but no CVAT overtly. Other:  MMM.   ED Results / Procedures / Treatments   Labs (all labs ordered are listed, but only abnormal results are displayed) Labs Reviewed  COMPREHENSIVE METABOLIC PANEL - Abnormal; Notable for the following components:      Result Value   Glucose, Bld 121 (*)    All other components within normal limits   URINALYSIS, ROUTINE W REFLEX MICROSCOPIC - Abnormal; Notable for the following components:   Color, Urine STRAW (*)    APPearance CLEAR (*)    Hgb urine dipstick LARGE (*)    All other components within normal limits  LIPASE, BLOOD  CBC  PREGNANCY, URINE     EKG    RADIOLOGY CT A/P: No acute findings, small lesion right kidney - recommend f/u MRI -CXR: Possible basilar atelectasis vs small PNA   I also independently reviewed and agree with radiologist interpretations.   PROCEDURES:  Critical Care performed: No   MEDICATIONS ORDERED IN ED: Medications  sodium chloride 0.9 % bolus 1,000 mL (1,000 mLs Intravenous New Bag/Given 10/04/22 0748)  ketorolac (TORADOL) 30 MG/ML injection 15 mg (15 mg Intravenous Given 10/04/22 0748)  dicyclomine (BENTYL) capsule 20 mg (20 mg Oral Given by Other 10/04/22 0749)  ipratropium-albuterol (DUONEB) 0.5-2.5 (3) MG/3ML nebulizer solution 3 mL (3 mLs Nebulization Given 10/04/22 0759)  morphine (PF) 4 MG/ML injection 4 mg (4 mg Intravenous Given 10/04/22 0827)  cefTRIAXone (ROCEPHIN) 2 g in sodium chloride 0.9 % 100 mL IVPB (0 g Intravenous Stopped 10/04/22 0859)  methylPREDNISolone sodium succinate (SOLU-MEDROL) 125 mg/2 mL injection 125 mg (125 mg Intravenous Given 10/04/22 0853)  traMADol (ULTRAM) tablet 100 mg (100 mg Oral Given 10/04/22 0859)     IMPRESSION / MDM / ASSESSMENT AND PLAN / ED COURSE  I reviewed the triage vital signs and the nursing notes.                              Differential diagnosis includes, but is not limited to, naltrexone adverse effect, viral GI illness, renal colic, pyelo, MSK pain/lumbar strain, enteritis.  Patient's presentation is most consistent with acute presentation with potential threat to life or bodily function.  47 yo F here with abdominal and back pain. Suspect pain 2/2 taking higher than usual dose of naltrexone causing stomach cramps/spasms, versus IBS or food-borne illness. Pt has no signs of  acute abnormality on CT, including no evidence of stone. CBC without leukocytosis. CMP unremarkable with normal LFTs and bilirubin. UA negative with exception of small blood which I suspect is from her menstrual cycle. No urinary sx.  Pt does have possible small area of atelectasis vs PNA on CXR. She has some wheezing. Will give her a course of ABX for this along with albuterol, and steroids. This could be some of her pain though clinically I have a high suspicion this is related to naltrexone and possible underlying opiate use as she did "take a percocet or two" before taking the naltrexone (not rx'ed).   Small nodule on CT - will refer for outpt f/u.  Will d/c with supportive care.      FINAL CLINICAL IMPRESSION(S) / ED DIAGNOSES   Final diagnoses:  Community acquired pneumonia, unspecified laterality  Generalized abdominal pain  Nodule of kidney     Rx / DC Orders   ED Discharge Orders          Ordered    amoxicillin-clavulanate (AUGMENTIN) 875-125 MG tablet  2 times daily        10/04/22 0844    azithromycin (ZITHROMAX Z-PAK) 250 MG tablet        10/04/22 0844    ondansetron (ZOFRAN-ODT) 4 MG disintegrating tablet  Every 8 hours PRN        10/04/22 0844    traMADol (ULTRAM) 50 MG tablet  Every 6 hours PRN        10/04/22 0901    albuterol (VENTOLIN HFA) 108 (90 Base) MCG/ACT inhaler  Every 4 hours PRN        10/04/22 0902             Note:  This document was prepared using Dragon voice recognition software and may include unintentional dictation errors.   Duffy Bruce, MD 10/04/22 [id number]

## 2022-10-04 NOTE — ED Triage Notes (Signed)
Pt presents ambulatory to triage via POV with complaints of generalized abdominal pain that started last night. Pt states that the pain persisted over night. No pain meds were taken PTA. Rates the pain 10/10. A&Ox4 at this time. Denies  fevers, N/V/D, constipation, CP or SOB.

## 2022-10-04 NOTE — ED Provider Notes (Signed)
[facility] Provider Note   CSN: [id number] Arrival date & time: 10/04/22  1522     History  Chief Complaint  Patient presents with   Back Pain    Melanie Nicholson is a 47 y.o. female.  47 yo F with a cc of left sided back pain.  Going on for a few days. Radiates to the abdomen.  Had some vomiting.    She went to [facility] and was seen.  Had CT imaging.  Started on antibiotics.  She had ongoing discomfort and came here to be evaluated.  She said it started when she was trying to organize coins while sitting on the floor.  She then realized it is worse with twisting turning standing certain positions.   Back Pain      Home Medications Prior to Admission medications   Medication Sig Start Date End Date Taking? Authorizing Provider  diclofenac Sodium (VOLTAREN) 1 % GEL Apply 4 g topically 4 (four) times daily. 10/04/22  Yes Deno Etienne, DO  methylPREDNISolone (MEDROL DOSEPAK) 4 MG TBPK tablet Day 1: 8mg  before breakfast, 4 mg after lunch, 4 mg after supper, and 8 mg at bedtime Day 2: 4 mg before breakfast, 4 mg after lunch, 4 mg  after supper, and 8 mg  at bedtime Day 3:  4 mg  before breakfast, 4 mg  after lunch, 4 mg after supper, and 4 mg  at bedtime Day 4: 4 mg  before breakfast, 4 mg  after lunch, and 4 mg at bedtime Day 5: 4 mg  before breakfast and 4 mg at bedtime Day 6: 4 mg  before breakfast 10/04/22  Yes Deno Etienne, DO  acetaminophen (TYLENOL 8 HOUR) 650 MG CR tablet Take 1 tablet (650 mg total) by mouth every 8 (eight) hours as needed for pain. 02/28/19   Rubie Maid, MD  albuterol (VENTOLIN HFA) 108 (90 Base) MCG/ACT inhaler Inhale 2 puffs into the lungs every 4 (four) hours as needed for wheezing or shortness of breath. 10/04/22   Duffy Bruce, MD  ALPRAZolam Duanne Moron) 1 MG tablet Take by mouth.    [provider]  amoxicillin-clavulanate (AUGMENTIN) 875-125 MG tablet Take 1 tablet by mouth 2 (two) times  daily for 7 days. 10/04/22 10/11/22  Duffy Bruce, MD  azithromycin (ZITHROMAX Z-PAK) 250 MG tablet Take 2 tablets (500 mg) on  Day 1,  followed by 1 tablet (250 mg) once daily on Days 2 through 5. 10/04/22 10/09/22  Duffy Bruce, MD  benzonatate (TESSALON PERLES) 100 MG capsule Take 1 capsule (100 mg total) by mouth 2 (two) times daily as needed for cough. 11/23/21   Poggi, Clarnce Flock, PA-C  clonazePAM (KLONOPIN) 1 MG tablet Take 1 mg by mouth 2 (two) times daily. Patient not taking: Reported on 08/25/2020    [provider]  ferrous sulfate 325 (65 FE) MG tablet Take 325 mg by mouth 2 (two) times a week. Patient not taking: Reported on 08/25/2020    [provider]  LATUDA 80 MG TABS tablet Take 80 mg by mouth at bedtime. 05/09/18   [provider]  Lemborexant (DAYVIGO PO) Take by mouth.    [provider]  levothyroxine (SYNTHROID, LEVOTHROID) 200 MCG tablet Take 200 mcg by mouth daily before breakfast.    [provider]  liraglutide (VICTOZA) 18 MG/3ML SOPN Inject 1.2 mg into the skin daily.    [provider]  lisdexamfetamine (VYVANSE) 60 MG capsule  Take 60 mg by mouth every morning.     [provider]  Multiple Vitamins-Minerals (BARIATRIC MULTIVITAMINS/IRON) CAPS Take 1 capsule by mouth 2 (two) times a week.     [provider]  mupirocin ointment (BACTROBAN) 2 % Apply to aa BID 08/25/20   Ralene Bathe, MD  ondansetron (ZOFRAN-ODT) 4 MG disintegrating tablet Take 1 tablet (4 mg total) by mouth every 8 (eight) hours as needed for nausea or vomiting. 10/04/22   Duffy Bruce, MD  predniSONE (DELTASONE) 20 MG tablet Take 2 tablets (40 mg total) by mouth daily for 5 days. 10/04/22 10/09/22  Duffy Bruce, MD  traMADol (ULTRAM) 50 MG tablet Take 1 tablet (50 mg total) by mouth every 6 (six) hours as needed for severe pain. 10/04/22   Duffy Bruce, MD      Allergies    Nsaids    Review of Systems   Review of Systems   Musculoskeletal:  Positive for back pain.    Physical Exam Updated Vital Signs BP (!) 130/92 (BP Location: Right Arm)   Pulse (!) 104   Temp 98.7 F (37.1 C)   Resp 18   SpO2 94%  Physical Exam Vitals and nursing note reviewed.  Constitutional:      General: She is not in acute distress.    Appearance: She is well-developed. She is not diaphoretic.  HENT:     Head: Normocephalic and atraumatic.  Eyes:     Pupils: Pupils are equal, round, and reactive to light.  Cardiovascular:     Rate and Rhythm: Normal rate and regular rhythm.     Heart sounds: No murmur heard.    No friction rub. No gallop.  Pulmonary:     Effort: Pulmonary effort is normal.     Breath sounds: No wheezing or rales.  Abdominal:     General: There is no distension.     Palpations: Abdomen is soft.     Tenderness: There is no abdominal tenderness.  Musculoskeletal:        General: No tenderness.     Cervical back: Normal range of motion and neck supple.     Comments: Pain to the left flank area, no obvious midline tenderness step-offs or deformities.  No rash.  No CVA tenderness.  Pulse motor and sensation intact distally.  Reflexes 2+ and equal.  No clonus.  Skin:    General: Skin is warm and dry.  Neurological:     Mental Status: She is alert and oriented to person, place, and time.  Psychiatric:        Behavior: Behavior normal.     ED Results / Procedures / Treatments   Labs (all labs ordered are listed, but only abnormal results are displayed) Labs Reviewed - No data to display  EKG None  Radiology DG Chest 2 View  Result Date: 10/04/2022 CLINICAL DATA:  Cough. EXAM: CHEST - 2 VIEW COMPARISON:  Chest x-ray Nov 23, 2021. FINDINGS: Small new opacity at the left lung base. No visible pleural effusions or pneumothorax. Cardiomediastinal silhouette is within normal limits. No evidence of acute osseous abnormality. IMPRESSION: Small left basilar opacity which could represent atelectasis,  aspiration, and/or early pneumonia. Electronically Signed   By: Margaretha Sheffield M.D.   On: 10/04/2022 08:24   CT ABDOMEN PELVIS WO CONTRAST  Result Date: 10/04/2022 CLINICAL DATA:  Abdominal pain EXAM: CT ABDOMEN AND PELVIS WITHOUT CONTRAST TECHNIQUE: Multidetector CT imaging of the abdomen and pelvis was performed following the standard protocol  without IV contrast. RADIATION DOSE REDUCTION: This exam was performed according to the departmental dose-optimization program which includes automated exposure control, adjustment of the mA and/or kV according to patient size and/or use of iterative reconstruction technique. COMPARISON:  None Available. FINDINGS: Lower chest: No acute abnormality. Hepatobiliary: No focal liver abnormality is seen. Status post cholecystectomy. No biliary dilatation. Pancreas: Unremarkable. No pancreatic ductal dilatation or surrounding inflammatory changes. Spleen: Normal in size without focal abnormality. Adrenals/Urinary Tract: Bilateral adrenal glands are unremarkable. No hydronephrosis or nephrolithiasis. Small indeterminate lesion of the lower pole the right kidney measuring 10 mm on series 2, image 38. Stomach/Bowel: Prior Roux-en-Y gastric bypass. Appendix appears normal. Mild diverticulosis. No evidence of bowel wall thickening, distention, or inflammatory changes. Vascular/Lymphatic: No significant vascular findings are present. No enlarged abdominal or pelvic lymph nodes. Reproductive: Uterus and bilateral adnexa are unremarkable. Other: No abdominal wall hernia or abnormality. No abdominopelvic ascites. Musculoskeletal: No acute or significant osseous findings. IMPRESSION: 1. No acute findings in the abdomen or pelvis. 2. Small indeterminate lesion of the lower pole the right kidney. Recommend follow-up abdominal MRI contrast in 3-6 months for further evaluation. Electronically Signed   By: Yetta Glassman M.D.   On: 10/04/2022 08:16    Procedures Procedures     Medications Ordered in ED Medications  acetaminophen (TYLENOL) tablet 1,000 mg (has no administration in time range)  ketorolac (TORADOL) 15 MG/ML injection 15 mg (has no administration in time range)  oxyCODONE (Oxy IR/ROXICODONE) immediate release tablet 5 mg (has no administration in time range)  diazepam (VALIUM) tablet 5 mg (has no administration in time range)    ED Course/ Medical Decision Making/ A&P                             Medical Decision Making Risk OTC drugs. Prescription drug management.   47 yo F with a chief complaints of left-sided back pain.  Going on for a few days.  Seems musculoskeletal by history and physical.  She was seen at [facility] regional today and had CT imaging as well as blood work.  This was all largely unremarkable.  Their notes suggested that perhaps her vomiting was due to taking too much opioid blockade.  Will hold off on prescribing narcotics for this.  She has an intolerance for NSAIDs we will do Medrol Dosepak.  Diclofenac gel.  PCP follow-up.  7:23 PM:  I have discussed the diagnosis/risks/treatment options with the patient and family.  Evaluation and diagnostic testing in the emergency department does not suggest an emergent condition requiring admission or immediate intervention beyond what has been performed at this time.  They will follow up with PCP. We also discussed returning to the ED immediately if new or worsening sx occur. We discussed the sx which are most concerning (e.g., sudden worsening pain, fever, inability to tolerate by mouth, cauda equina s/sx) that necessitate immediate return. Medications administered to the patient during their visit and any new prescriptions provided to the patient are listed below.  Medications given during this visit Medications  acetaminophen (TYLENOL) tablet 1,000 mg (has no administration in time range)  ketorolac (TORADOL) 15 MG/ML injection 15 mg (has no administration in time range)  oxyCODONE  (Oxy IR/ROXICODONE) immediate release tablet 5 mg (has no administration in time range)  diazepam (VALIUM) tablet 5 mg (has no administration in time range)     The patient appears reasonably screen and/or stabilized for discharge and I  doubt any other medical condition or other [facility] requiring further screening, evaluation, or treatment in the ED at this time prior to discharge.          Final Clinical Impression(s) / ED Diagnoses Final diagnoses:  Acute left-sided low back pain without sciatica    Rx / DC Orders ED Discharge Orders          Ordered    methylPREDNISolone (MEDROL DOSEPAK) 4 MG TBPK tablet        10/04/22 1919    diclofenac Sodium (VOLTAREN) 1 % GEL  4 times daily        10/04/22 Stollings, [place], DO 10/04/22 1923

## 2022-10-04 NOTE — ED Triage Notes (Signed)
Pt came in via POV d/t all over back pain that started last night after sitting in the floor after she was sitting in the floor sorting through her change without out sitting on a cushion & the pain is rated 9/10.

## 2022-10-04 NOTE — Discharge Instructions (Addendum)
Your back pain is most likely due to a muscular strain.  There is been a lot of research on back pain, unfortunately the only thing that seems to really help is Tylenol and ibuprofen.  Relative rest is also important to not lift greater than 10 pounds bending or twisting at the waist.  Please follow-up with your family physician.  The other thing that really seems to benefit patients is physical therapy which your doctor may send you for.  Please return to the emergency department for new numbness or weakness to your arms or legs. Difficulty with urinating or urinating or pooping on yourself.  Also if you cannot feel toilet paper when you wipe or get a fever.   Take the steroids as prescribed.  Use the gel as prescribed Also take tylenol 1000mg(2 extra strength) four times a day.   

## 2022-10-13 ENCOUNTER — Other Ambulatory Visit: Payer: Self-pay

## 2022-10-13 ENCOUNTER — Emergency Department
Admission: EM | Admit: 2022-10-13 | Discharge: 2022-10-13 | Disposition: A | Discharge status: Home / Self Care | Payer: Medicaid Other | Attending: Emergency Medicine | Admitting: Emergency Medicine

## 2022-10-13 ENCOUNTER — Emergency Department: Payer: Medicaid Other

## 2022-10-13 DIAGNOSIS — J45909 Unspecified asthma, uncomplicated: Secondary | ICD-10-CM | POA: Insufficient documentation

## 2022-10-13 DIAGNOSIS — R5383 Other fatigue: Secondary | ICD-10-CM | POA: Insufficient documentation

## 2022-10-13 DIAGNOSIS — R7989 Other specified abnormal findings of blood chemistry: Secondary | ICD-10-CM

## 2022-10-13 DIAGNOSIS — J449 Chronic obstructive pulmonary disease, unspecified: Secondary | ICD-10-CM | POA: Diagnosis not present

## 2022-10-13 DIAGNOSIS — R531 Weakness: Secondary | ICD-10-CM | POA: Diagnosis not present

## 2022-10-13 DIAGNOSIS — Z716 Tobacco abuse counseling: Secondary | ICD-10-CM | POA: Diagnosis not present

## 2022-10-13 DIAGNOSIS — R42 Dizziness and giddiness: Secondary | ICD-10-CM | POA: Diagnosis not present

## 2022-10-13 DIAGNOSIS — E039 Hypothyroidism, unspecified: Secondary | ICD-10-CM | POA: Diagnosis not present

## 2022-10-13 LAB — CBC
HCT: 41.4 % (ref 36.0–46.0)
Hemoglobin: 13.6 g/dL (ref 12.0–15.0)
MCH: 29.2 pg (ref 26.0–34.0)
MCHC: 32.9 g/dL (ref 30.0–36.0)
MCV: 89 fL (ref 80.0–100.0)
Platelets: 259 10*3/uL (ref 150–400)
RBC: 4.65 MIL/uL (ref 3.87–5.11)
RDW: 12.8 % (ref 11.5–15.5)
WBC: 10.5 10*3/uL (ref 4.0–10.5)
nRBC: 0 % (ref 0.0–0.2)

## 2022-10-13 LAB — URINALYSIS, ROUTINE W REFLEX MICROSCOPIC
Bilirubin Urine: NEGATIVE
Glucose, UA: NEGATIVE mg/dL
Hgb urine dipstick: NEGATIVE
Ketones, ur: NEGATIVE mg/dL
Leukocytes,Ua: NEGATIVE
Nitrite: NEGATIVE
Protein, ur: NEGATIVE mg/dL
Specific Gravity, Urine: 1.018 (ref 1.005–1.030)
pH: 6 (ref 5.0–8.0)

## 2022-10-13 LAB — BASIC METABOLIC PANEL
Anion gap: 9 (ref 5–15)
BUN: 11 mg/dL (ref 6–20)
CO2: 24 mmol/L (ref 22–32)
Calcium: 9 mg/dL (ref 8.9–10.3)
Chloride: 104 mmol/L (ref 98–111)
Creatinine, Ser: 0.77 mg/dL (ref 0.44–1.00)
GFR, Estimated: >60 mL/min (ref >60)
Glucose, Bld: 108 mg/dL — ABNORMAL HIGH (ref 70–99)
Potassium: 5.2 mmol/L — ABNORMAL HIGH (ref 3.5–5.1)
Sodium: 137 mmol/L (ref 135–145)

## 2022-10-13 LAB — HEPATIC FUNCTION PANEL
ALT: 17 U/L (ref 0–44)
AST: 19 U/L (ref 15–41)
Albumin: 3.7 g/dL (ref 3.5–5.0)
Alkaline Phosphatase: 31 U/L — ABNORMAL LOW (ref 38–126)
Bilirubin, Direct: <0.1 mg/dL (ref 0.0–0.2)
Total Bilirubin: 0.4 mg/dL (ref 0.3–1.2)
Total Protein: 6.7 g/dL (ref 6.5–8.1)

## 2022-10-13 LAB — POC URINE PREG, ED: Preg Test, Ur: NEGATIVE

## 2022-10-13 LAB — TSH: TSH: 2.176 u[IU]/mL (ref 0.350–4.500)

## 2022-10-13 LAB — TROPONIN I (HIGH SENSITIVITY): Troponin I (High Sensitivity): <2 ng/L (ref <18)

## 2022-10-13 LAB — T4, FREE: Free T4: 0.6 ng/dL — ABNORMAL LOW (ref 0.61–1.12)

## 2022-10-13 MED ORDER — LEVOTHYROXINE SODIUM 200 MCG PO TABS: 200.0000 ug | ORAL_TABLET | Freq: Every day | ORAL | 11 refills | Status: DC

## 2022-10-13 MED ORDER — NICOTINE 14 MG/24HR TD PT24: 14.0000 mg | MEDICATED_PATCH | Freq: Every day | TRANSDERMAL | 0 refills | Status: AC

## 2022-10-13 MED ORDER — NICOTINE POLACRILEX 4 MG MT LOZG: 4.0000 mg | LOZENGE | OROMUCOSAL | 0 refills | Status: DC | PRN

## 2022-10-13 NOTE — Discharge Instructions (Addendum)
Call your doctor for follow-up appointment this week to check thyroid levels and take the increased dose of your thyroid medicine as prescribed.  Thank you for choosing [facility] for your health care today!  Please see your primary doctor this week for a follow up appointment.   Sometimes, in the early stages of certain disease courses it is difficult to detect in the emergency department evaluation -- so, it is important that you continue to monitor your symptoms and call your doctor right away or return to the emergency department if you develop any new or worsening symptoms.  Please go to the following website to schedule new (and existing) patient appointments:   [email or web address]  If you do not have a primary doctor try calling the following clinics to establish care:  If you have insurance:  [facility] [phone] [address] [place]., [place] [address]   [facility]  [phone] [address] [place] [place] [address]   If you do not have insurance:  [facility]  [phone] [address] [place] [address]   The following is another list of primary care offices in the area who are accepting new patients at this time.  Please reach out to one of them directly and let them know you would like to schedule an appointment to follow up on an Emergency Department visit, and/or to establish a new primary care provider (PCP).  There are likely other primary care clinics in the are who are accepting new patients, but this is an excellent place to start:  [facility] Lead physician: Dr Shirlee Latch [address] [place], [place] [address] [phone]  [facility] Lead Physician: Dr Alba Cory [address], [place], [place] [address] [phone]  [facility]  Lead Physician: Dr Olevia Perches [address] [place], [place] [address] [phone]  [facility] Lead Physician: Dr Sofie Hartigan [address] [place], [place], [place] [address] [phone]  [facility] Lead Physician: Dr Bari Edward [address] [place], [place] [address] [phone]   It was my pleasure to care for you today.   Daneil Dan Modesto Charon, MD

## 2022-10-13 NOTE — ED Provider Notes (Signed)
[facility] Provider Note    Event Date/Time   First MD Initiated Contact with Patient 10/13/22 1643     (approximate)   History   Dizziness   HPI  Melanie Nicholson is a 47 y.o. female   Past medical history of iron deficiency anemia, asthma, anxiety, bipolar, COPD and current smoker, hypothyroid, sleep apnea who presents to the emergency department with fatigue and generalized weakness for 2 days.  She started feeling fatigued yesterday morning and took a nap in the afternoon when she awoke she continued to feel fatigued with some mental fogginess.  The symptoms continued today.  She denies trauma or recent illnesses.  She has no other acute medical complaints and specifically on review of systems she denies cough, shortness of breath, chest pain, headache, vision changes, urinary symptoms, GI symptoms or drug or alcohol use.  She has been fully compliant with all of her medications including her thyroid medicines and has had no recent adjustments.  Independent Historian contributed to assessment above: Daughter who is at bedside who corroborates information given above  External Medical Documents Reviewed: Emergency department visit dated 10/04/2022 with abdominal and back pain, found to have questionable pneumonia on chest x-ray with some wheezing and was discharged with antibiotics and COPD treatment.      Physical Exam   Triage Vital Signs: ED Triage Vitals  Enc Vitals Group     BP 10/13/22 1531 (!) 130/101     Pulse Rate 10/13/22 1531 83     Resp 10/13/22 1531 16     Temp 10/13/22 1531 98.9 F (37.2 C)     Temp Source 10/13/22 1531 Oral     SpO2 10/13/22 1531 100 %     Weight --      Height --      Head Circumference --      Peak Flow --      Pain Score 10/13/22 1535 0     Pain Loc --      Pain Edu? --      Excl. in GC? --     Most recent vital signs: Vitals:   10/13/22 1531  BP: (!) 130/101  Pulse: 83  Resp: 16  Temp: 98.9 F (37.2  C)  SpO2: 100%    General: Awake, no distress.  CV:  Good peripheral perfusion.  Resp:  Normal effort.  Abd:  No distention.  Other:  Awake alert oriented pleasant with normal vital signs and afebrile.  Lungs clear without wheezing or focality.  Skin appears warm well-perfused neck is supple with full range of motion.  She has no focal neurologic deficits her gait is normal no facial asymmetry no dysarthria no visual cuts and normal motor or sensory normal finger-to-nose.   ED Results / Procedures / Treatments   Labs (all labs ordered are listed, but only abnormal results are displayed) Labs Reviewed  BASIC METABOLIC PANEL - Abnormal; Notable for the following components:      Result Value   Potassium 5.2 (*)    Glucose, Bld 108 (*)    All other components within normal limits  URINALYSIS, ROUTINE W REFLEX MICROSCOPIC - Abnormal; Notable for the following components:   Color, Urine YELLOW (*)    APPearance HAZY (*)    All other components within normal limits  T4, FREE - Abnormal; Notable for the following components:   Free T4 0.60 (*)    All other components within normal limits  HEPATIC FUNCTION PANEL -  Abnormal; Notable for the following components:   Alkaline Phosphatase 31 (*)    All other components within normal limits  CBC  TSH  POC URINE PREG, ED  CBG MONITORING, ED  TROPONIN I (HIGH SENSITIVITY)     I ordered and reviewed the above labs they are notable for she has a normal H&H and normal white blood cell count and her potassium slightly elevated 5.2  EKG  ED ECG REPORT I, Pilar JarvisSilas Rylei Codispoti, the attending physician, personally viewed and interpreted this ECG.   Date: 10/13/2022  EKG Time: 1541  Rate: 89  Rhythm: sinus  Axis: nl  Intervals:none  ST&T Change: No acute ischemic changes    RADIOLOGY I independently reviewed and interpreted chest x-ray and see no obvious focality or pneumothorax   PROCEDURES:  Critical Care performed:  No  Procedures   MEDICATIONS ORDERED IN ED: Medications - No data to display   IMPRESSION / MDM / ASSESSMENT AND PLAN / ED COURSE  I reviewed the triage vital signs and the nursing notes.                                Patient's presentation is most consistent with acute presentation with potential threat to life or bodily function.  Differential diagnosis includes, but is not limited to, metabolic derangement, hypoglycemia, thyroid dysfunction, ACS, infection like urinary tract infection or pneumonia, anemia, considered but less likely CVA   MDM: Patient with generalized weakness and fatigue over the last 2 days with no other focal infectious complaints, pain or focal neurologic deficits.  Will check basic labs, infectious workup with urinalysis and chest x-ray check thyroid function test.  I do not think that she is having a CVA given no focal neurologic deficits.  Would be very atypical for ACS given no chest pain or shortness of breath.  EKG is nonischemic.  I will check a troponin as well.  I spent 5 minutes counseling this patient on smoking cessation.  We spoke about the patient's current tobacco use, impact of smoking, assessed willingness to quit, methods for cessation including medical management and nicotine replacement therapy (which I prescribed to the patient) and advised follow-up with primary doctor to continue to address smoking cessation.   I considered hospitalization for admission or observation however given overall well appearance and stability in the emergency department as well as a slightly subtherapeutic T4 level I think an adjustment up titration of her levothyroxine to previously prescribed 200 mcg daily and a recheck of symptoms and thyroid studies from PMD is most appropriate at this time.  Patient in agreement.  Return precautions given.  Discharge.        FINAL CLINICAL IMPRESSION(S) / ED DIAGNOSES   Final diagnoses:  Generalized weakness   Other fatigue  Encounter for smoking cessation counseling  Low T4     Rx / DC Orders   ED Discharge Orders          Ordered    nicotine (NICODERM CQ - DOSED IN MG/24 HOURS) 14 mg/24hr patch  Daily        10/13/22 1733    nicotine polacrilex (NICOTINE MINI) 4 MG lozenge  As needed        10/13/22 1733    levothyroxine (SYNTHROID) 200 MCG tablet  Daily        10/13/22 1834             Note:  This document  was prepared using Conservation officer, historic buildingsDragon voice recognition software and may include unintentional dictation errors. m   Pilar JarvisWong, Hoda Hon, MD 10/13/22 [phone]

## 2022-10-13 NOTE — ED Triage Notes (Signed)
Pt comes with c/o dizziness and just not feeling well. Pt states she went to sleep yesterday for a nap and felt fine earlier.  Pt states she woke up from nap and has since just felt like she is still asleep and not feeling right. Pt states some dizziness. Pt denies any pain.

## 2023-08-13 ENCOUNTER — Encounter: Payer: Self-pay | Admitting: Oncology

## 2023-08-15 ENCOUNTER — Telehealth: Payer: MEDICAID

## 2023-08-15 DIAGNOSIS — N2889 Other specified disorders of kidney and ureter: Secondary | ICD-10-CM

## 2023-08-15 DIAGNOSIS — N289 Disorder of kidney and ureter, unspecified: Secondary | ICD-10-CM

## 2023-08-15 NOTE — Progress Notes (Signed)
 Virtual Visit Consent   Melanie Nicholson, you are scheduled for a virtual visit with a [facility] provider today. Just as with appointments in the office, your consent must be obtained to participate. Your consent will be active for this visit and any virtual visit you may have with one of our providers in the next 365 days. If you have a MyChart account, a copy of this consent can be sent to you electronically.  As this is a virtual visit, video technology does not allow for your provider to perform a traditional examination. This may limit your provider's ability to fully assess your condition. If your provider identifies any concerns that need to be evaluated in person or the need to arrange testing (such as labs, EKG, etc.), we will make arrangements to do so. Although advances in technology are sophisticated, we cannot ensure that it will always work on either your end or our end. If the connection with a video visit is poor, the visit may have to be switched to a telephone visit. With either a video or telephone visit, we are not always able to ensure that we have a secure connection.  By engaging in this virtual visit, you consent to the provision of healthcare and authorize for your insurance to be billed (if applicable) for the services provided during this visit. Depending on your insurance coverage, you may receive a charge related to this service.  I need to obtain your verbal consent now. Are you willing to proceed with your visit today? Melanie Nicholson has provided verbal consent on 08/15/2023 for a virtual visit (video or telephone). Jon CHRISTELLA Belt, NP  Date: 08/15/2023 11:27 AM  Virtual Visit via Video Note   I, Jon CHRISTELLA Belt, connected with  Melanie Nicholson  ([id number], February 14, 1976) on 08/15/23 at 11:00 AM EST by a video-enabled telemedicine application and verified that I am speaking with the correct person using two identifiers.  Location: Patient: Virtual Visit Location Patient:  Other: her car in . She is not driving Provider: Virtual Visit Location Provider: Home Office   I discussed the limitations of evaluation and management by telemedicine and the availability of in person appointments. The patient expressed understanding and agreed to proceed.    History of Present Illness: Melanie Nicholson is a 48 y.o. who identifies as a female who was assigned female at birth, and is being seen today for f/u abnormal imaging. Had a CT scan 10/04/22 for ED visit for R sided back pain that showed:  Small indeterminate lesion of the lower pole the right kidney measuring 10 mm on series 2, image 38.  Pt feels well and does not have any concerns. She is between PCPs, has new PCP appt in April, prefers I arrange MRI for her and not wait until she sees new PCP.   HPI: HPI  Problems:  Patient Active Problem List   Diagnosis Date Noted   Major depressive disorder, recurrent episode, severe (HCC) 11/04/2020   ADD (attention deficit disorder) 11/04/2020   Schizoaffective disorder (HCC) 11/04/2020   Bipolar disorder (HCC) 11/04/2020   OCD (obsessive compulsive disorder) 11/04/2020   Generalized anxiety disorder 11/04/2020   Menometrorrhagia 02/11/2019   Iron deficiency anemia due to chronic blood loss 09/11/2018   History of cesarean section 03/02/2016   History of ectopic pregnancy 03/02/2016   History of cholecystectomy 03/02/2016   History of gastric bypass 03/02/2016    Allergies:  Allergies  Allergen Reactions   Nsaids Other (See  Comments)    History of gastric bypass   Medications:  Current Outpatient Medications:    acetaminophen  (TYLENOL  8 HOUR) 650 MG CR tablet, Take 1 tablet (650 mg total) by mouth every 8 (eight) hours as needed for pain., Disp: 30 tablet, Rfl: 1   albuterol  (VENTOLIN  HFA) 108 (90 Base) MCG/ACT inhaler, Inhale 2 puffs into the lungs every 4 (four) hours as needed for wheezing or shortness of breath., Disp: 8 g, Rfl: 1   ALPRAZolam (XANAX) 1 MG  tablet, Take by mouth., Disp: , Rfl:    benzonatate  (TESSALON  PERLES) 100 MG capsule, Take 1 capsule (100 mg total) by mouth 2 (two) times daily as needed for cough., Disp: 15 capsule, Rfl: 0   clonazePAM (KLONOPIN) 1 MG tablet, Take 1 mg by mouth 2 (two) times daily. (Patient not taking: Reported on 08/25/2020), Disp: , Rfl:    diclofenac  Sodium (VOLTAREN ) 1 % GEL, Apply 4 g topically 4 (four) times daily., Disp: 100 g, Rfl: 0   ferrous sulfate 325 (65 FE) MG tablet, Take 325 mg by mouth 2 (two) times a week. (Patient not taking: Reported on 08/25/2020), Disp: , Rfl:    LATUDA 80 MG TABS tablet, Take 80 mg by mouth at bedtime., Disp: , Rfl:    Lemborexant (DAYVIGO PO), Take by mouth., Disp: , Rfl:    levothyroxine  (SYNTHROID ) 200 MCG tablet, Take 1 tablet (200 mcg total) by mouth daily., Disp: 30 tablet, Rfl: 11   liraglutide (VICTOZA) 18 MG/3ML SOPN, Inject 1.2 mg into the skin daily., Disp: , Rfl:    lisdexamfetamine (VYVANSE) 60 MG capsule, Take 60 mg by mouth every morning. , Disp: , Rfl:    methylPREDNISolone  (MEDROL  DOSEPAK) 4 MG TBPK tablet, Day 1: 8mg  before breakfast, 4 mg after lunch, 4 mg after supper, and 8 mg at bedtime Day 2: 4 mg before breakfast, 4 mg after lunch, 4 mg  after supper, and 8 mg  at bedtime Day 3:  4 mg  before breakfast, 4 mg  after lunch, 4 mg after supper, and 4 mg  at bedtime Day 4: 4 mg  before breakfast, 4 mg  after lunch, and 4 mg at bedtime Day 5: 4 mg  before breakfast and 4 mg at bedtime Day 6: 4 mg  before breakfast, Disp: 1 each, Rfl: 0   Multiple Vitamins-Minerals (BARIATRIC MULTIVITAMINS/IRON) CAPS, Take 1 capsule by mouth 2 (two) times a week. , Disp: , Rfl:    mupirocin  ointment (BACTROBAN ) 2 %, Apply to aa BID, Disp: 22 g, Rfl: 0   nicotine  polacrilex (NICOTINE  MINI) 4 MG lozenge, Take 1 lozenge (4 mg total) by mouth as needed for smoking cessation., Disp: 100 tablet, Rfl: 0   ondansetron  (ZOFRAN -ODT) 4 MG disintegrating tablet, Take 1 tablet (4 mg total)  by mouth every 8 (eight) hours as needed for nausea or vomiting., Disp: 12 tablet, Rfl: 0   traMADol  (ULTRAM ) 50 MG tablet, Take 1 tablet (50 mg total) by mouth every 6 (six) hours as needed for severe pain., Disp: 8 tablet, Rfl: 0  Observations/Objective: Patient is well-developed, well-nourished in no acute distress.  Resting comfortably in car in [place].  Head is normocephalic, atraumatic.  No labored breathing.  Speech is clear and coherent with logical content.  Patient is alert and oriented at baseline.    Assessment and Plan: 1. Kidney lesion (Primary)  Abd MRI ordered per radiologist recommendations.   Follow Up Instructions: I discussed the assessment and treatment plan with the  patient. The patient was provided an opportunity to ask questions and all were answered. The patient agreed with the plan and demonstrated an understanding of the instructions.  A copy of instructions were sent to the patient via MyChart unless otherwise noted below.   The patient was advised to call back or seek an in-person evaluation if the symptoms worsen or if the condition fails to improve as anticipated.    Jon CHRISTELLA Belt, NP

## 2023-08-27 ENCOUNTER — Ambulatory Visit
Admission: RE | Admit: 2023-08-27 | Discharge: 2023-08-27 | Disposition: A | Discharge status: Home / Self Care | Payer: MEDICAID | Source: Ambulatory Visit | Attending: Emergency Medicine | Admitting: Emergency Medicine

## 2023-08-27 DIAGNOSIS — N2889 Other specified disorders of kidney and ureter: Secondary | ICD-10-CM | POA: Insufficient documentation

## 2023-08-27 MED ORDER — GADOBUTROL 1 MMOL/ML IV SOLN
7.5000 mL | Freq: Once | INTRAVENOUS | Status: AC | PRN
  Administered 2023-08-27: 7.5 mL via INTRAVENOUS

## 2023-08-31 ENCOUNTER — Encounter: Payer: Self-pay | Admitting: Emergency Medicine

## 2023-10-23 ENCOUNTER — Encounter: Payer: Self-pay | Admitting: Internal Medicine

## 2023-10-23 ENCOUNTER — Ambulatory Visit: Payer: MEDICAID | Admitting: Internal Medicine

## 2023-10-23 VITALS — BP 110/70 | HR 95 | Resp 16 | Ht 62.0 in | Wt 174.1 lb

## 2023-10-23 DIAGNOSIS — D500 Iron deficiency anemia secondary to blood loss (chronic): Secondary | ICD-10-CM | POA: Diagnosis not present

## 2023-10-23 DIAGNOSIS — E02 Subclinical iodine-deficiency hypothyroidism: Secondary | ICD-10-CM

## 2023-10-23 DIAGNOSIS — F332 Major depressive disorder, recurrent severe without psychotic features: Secondary | ICD-10-CM

## 2023-10-23 DIAGNOSIS — Z1231 Encounter for screening mammogram for malignant neoplasm of breast: Secondary | ICD-10-CM

## 2023-10-23 DIAGNOSIS — Z9884 Bariatric surgery status: Secondary | ICD-10-CM

## 2023-10-23 DIAGNOSIS — R635 Abnormal weight gain: Secondary | ICD-10-CM

## 2023-10-23 DIAGNOSIS — Z1211 Encounter for screening for malignant neoplasm of colon: Secondary | ICD-10-CM

## 2023-10-23 DIAGNOSIS — Z13220 Encounter for screening for lipoid disorders: Secondary | ICD-10-CM

## 2023-10-23 NOTE — Patient Instructions (Addendum)
 It was great seeing you today!  Plan discussed at today's visit: -Blood work ordered today, results will be uploaded to MyChart. Please return fasting for blood work. Lab hours are Monday-Friday 8 - 11:3- and 1:30 - 3:30.  -Mammogram ordered, please call to schedule -Cologuard ordered as well  Follow up in: 1 month to discuss weight loss medications and Pap  Take care and let us  know if you have any questions or concerns prior to your next visit.  Dr. Bud Care
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
## 2023-12-26 LAB — COLOGUARD: COLOGUARD: NEGATIVE

## 2023-12-27 ENCOUNTER — Ambulatory Visit: Payer: Self-pay | Admitting: Internal Medicine

## 2023-12-27 DIAGNOSIS — E559 Vitamin D deficiency, unspecified: Secondary | ICD-10-CM

## 2024-01-09 LAB — COMPLETE METABOLIC PANEL WITHOUT GFR
AG Ratio: 2 (calc) (ref 1.0–2.5)
ALT: 50 U/L — ABNORMAL HIGH (ref 6–29)
AST: 51 U/L — ABNORMAL HIGH (ref 10–35)
Albumin: 4.5 g/dL (ref 3.6–5.1)
Alkaline phosphatase (APISO): 26 U/L — ABNORMAL LOW (ref 31–125)
BUN/Creatinine Ratio: 9 (calc) (ref 6–22)
BUN: 11 mg/dL (ref 7–25)
CO2: 31 mmol/L (ref 20–32)
Calcium: 9.1 mg/dL (ref 8.6–10.2)
Chloride: 104 mmol/L (ref 98–110)
Creat: 1.22 mg/dL — ABNORMAL HIGH (ref 0.50–0.99)
Globulin: 2.3 g/dL (ref 1.9–3.7)
Glucose, Bld: 68 mg/dL (ref 65–99)
Potassium: 4.2 mmol/L (ref 3.5–5.3)
Sodium: 140 mmol/L (ref 135–146)
Total Bilirubin: 0.5 mg/dL (ref 0.2–1.2)
Total Protein: 6.8 g/dL (ref 6.1–8.1)

## 2024-01-09 LAB — VITAMIN B12: Vitamin B-12: 598 pg/mL (ref 200–1100)

## 2024-01-09 LAB — CBC WITH DIFFERENTIAL/PLATELET
Absolute Lymphocytes: 2739 {cells}/uL (ref 850–3900)
Absolute Monocytes: 337 {cells}/uL (ref 200–950)
Basophils Absolute: 51 {cells}/uL (ref 0–200)
Basophils Relative: 1 %
Eosinophils Absolute: 199 {cells}/uL (ref 15–500)
Eosinophils Relative: 3.9 %
HCT: 37.3 % (ref 35.0–45.0)
Hemoglobin: 12.5 g/dL (ref 11.7–15.5)
MCH: 31.6 pg (ref 27.0–33.0)
MCHC: 33.5 g/dL (ref 32.0–36.0)
MCV: 94.4 fL (ref 80.0–100.0)
MPV: 11 fL (ref 7.5–12.5)
Monocytes Relative: 6.6 %
Neutro Abs: 1775 {cells}/uL (ref 1500–7800)
Neutrophils Relative %: 34.8 %
Platelets: 174 10*3/uL (ref 140–400)
RBC: 3.95 10*6/uL (ref 3.80–5.10)
RDW: 12.6 % (ref 11.0–15.0)
Total Lymphocyte: 53.7 %
WBC: 5.1 10*3/uL (ref 3.8–10.8)

## 2024-01-09 LAB — THYROID PANEL WITH TSH
T4, Total: <0.7 ug/dL — ABNORMAL LOW (ref 5.1–11.9)
TSH: >150 m[IU]/L — AB (ref 22–35)
TSH: >150 m[IU]/L — ABNORMAL HIGH

## 2024-01-09 LAB — LIPID PANEL
Cholesterol: 250 mg/dL — ABNORMAL HIGH (ref <200)
HDL: 65 mg/dL (ref >=50)
LDL Cholesterol (Calc): 160 mg/dL — ABNORMAL HIGH
Non-HDL Cholesterol (Calc): 185 mg/dL — ABNORMAL HIGH (ref <130)
Total CHOL/HDL Ratio: 3.8 (calc) (ref <5.0)
Triglycerides: 129 mg/dL (ref <150)

## 2024-01-09 LAB — HEMOGLOBIN A1C
Hgb A1c MFr Bld: 5.3 % (ref <5.7)
Mean Plasma Glucose: 105 mg/dL
eAG (mmol/L): 5.8 mmol/L

## 2024-01-09 LAB — VITAMIN D 25 HYDROXY (VIT D DEFICIENCY, FRACTURES): Vit D, 25-Hydroxy: 26 ng/mL — ABNORMAL LOW (ref 30–100)

## 2024-01-10 ENCOUNTER — Other Ambulatory Visit: Payer: Self-pay | Admitting: Internal Medicine

## 2024-01-10 DIAGNOSIS — E039 Hypothyroidism, unspecified: Secondary | ICD-10-CM

## 2024-01-10 MED ORDER — LEVOTHYROXINE SODIUM 200 MCG PO TABS: 200.0000 ug | ORAL_TABLET | Freq: Every day | ORAL | 0 refills | Status: DC

## 2024-01-10 MED ORDER — VITAMIN D (ERGOCALCIFEROL) 1.25 MG (50000 UNIT) PO CAPS
50000.0000 [IU] | ORAL_CAPSULE | ORAL | 0 refills | Status: DC
Start: 2024-01-10 — End: 2024-04-07

## 2024-01-29 ENCOUNTER — Ambulatory Visit: Payer: MEDICAID | Admitting: Internal Medicine

## 2024-02-25 ENCOUNTER — Other Ambulatory Visit: Payer: Self-pay

## 2024-02-25 ENCOUNTER — Emergency Department
Admission: EM | Admit: 2024-02-25 | Discharge: 2024-02-25 | Disposition: A | Discharge status: Home / Self Care | Payer: MEDICAID | Attending: Emergency Medicine | Admitting: Emergency Medicine

## 2024-02-25 DIAGNOSIS — Y92410 Unspecified street and highway as the place of occurrence of the external cause: Secondary | ICD-10-CM | POA: Insufficient documentation

## 2024-02-25 DIAGNOSIS — M7918 Myalgia, other site: Secondary | ICD-10-CM

## 2024-02-25 DIAGNOSIS — M79606 Pain in leg, unspecified: Secondary | ICD-10-CM | POA: Diagnosis not present

## 2024-02-25 DIAGNOSIS — R519 Headache, unspecified: Secondary | ICD-10-CM | POA: Diagnosis not present

## 2024-02-25 DIAGNOSIS — J4489 Other specified chronic obstructive pulmonary disease: Secondary | ICD-10-CM | POA: Diagnosis not present

## 2024-02-25 DIAGNOSIS — M549 Dorsalgia, unspecified: Secondary | ICD-10-CM | POA: Insufficient documentation

## 2024-02-25 MED ORDER — CYCLOBENZAPRINE HCL 5 MG PO TABS: 5.0000 mg | ORAL_TABLET | Freq: Three times a day (TID) | ORAL | 0 refills | Status: DC | PRN
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
## 2024-04-07 ENCOUNTER — Encounter: Payer: Self-pay | Admitting: Internal Medicine

## 2024-04-07 ENCOUNTER — Encounter: Payer: Self-pay | Admitting: Oncology

## 2024-04-07 ENCOUNTER — Other Ambulatory Visit (HOSPITAL_COMMUNITY): Payer: Self-pay

## 2024-04-07 ENCOUNTER — Ambulatory Visit: Payer: MEDICAID | Admitting: Internal Medicine

## 2024-04-07 ENCOUNTER — Other Ambulatory Visit: Payer: Self-pay

## 2024-04-07 ENCOUNTER — Telehealth: Payer: Self-pay | Admitting: Pharmacy Technician

## 2024-04-07 VITALS — BP 112/84 | HR 99 | Temp 98.2°F | Resp 16 | Ht 62.0 in | Wt 181.1 lb

## 2024-04-07 DIAGNOSIS — Z6833 Body mass index (BMI) 33.0-33.9, adult: Secondary | ICD-10-CM | POA: Diagnosis not present

## 2024-04-07 DIAGNOSIS — Z23 Encounter for immunization: Secondary | ICD-10-CM

## 2024-04-07 DIAGNOSIS — E782 Mixed hyperlipidemia: Secondary | ICD-10-CM

## 2024-04-07 DIAGNOSIS — E039 Hypothyroidism, unspecified: Secondary | ICD-10-CM

## 2024-04-07 DIAGNOSIS — E66811 Obesity, class 1: Secondary | ICD-10-CM

## 2024-04-07 DIAGNOSIS — K219 Gastro-esophageal reflux disease without esophagitis: Secondary | ICD-10-CM

## 2024-04-07 DIAGNOSIS — Z9884 Bariatric surgery status: Secondary | ICD-10-CM | POA: Diagnosis not present

## 2024-04-07 DIAGNOSIS — E559 Vitamin D deficiency, unspecified: Secondary | ICD-10-CM

## 2024-04-07 DIAGNOSIS — R748 Abnormal levels of other serum enzymes: Secondary | ICD-10-CM

## 2024-04-07 MED ORDER — WEGOVY 0.25 MG/0.5ML ~~LOC~~ SOAJ
0.2500 mg | SUBCUTANEOUS | 0 refills | Status: AC
Start: 2024-04-07 — End: ?

## 2024-04-07 MED ORDER — OMEPRAZOLE 20 MG PO CPDR
20.0000 mg | DELAYED_RELEASE_CAPSULE | Freq: Every day | ORAL | 1 refills | Status: AC
Start: 2024-04-07 — End: ?

## 2024-04-07 NOTE — Telephone Encounter (Signed)
 Pharmacy Patient Advocate Encounter  Effective October 1st, [place] will discontinue coverage of GLP1 medications for weight loss (such as Wegovy and Zepbound ), unless the patient has a documented history of a heart attack or stroke. Zepbound  will continue to be covered only for patients with moderate to severe sleep apnea (AHI 15-30). Because of this change, the prior authorization team will not be submitting new PA requests for GLP1 medications prescribed for weight loss between now and October 1st, as patients will be unable to continue therapy under Medicaid coverage.

## 2024-04-07 NOTE — Progress Notes (Signed)
 Established Patient Office Visit  Subjective    Patient ID: Melanie Nicholson, female    DOB: [birth year]  Age: 48 y.o. MRN: [id number]  CC:  Chief Complaint  Patient presents with   Hypothyroidism    Refill medication   Obesity    HPI [name] presents to follow up on chronic medical conditions.  Discussed the use of AI scribe software for clinical note transcription with the patient, who gave verbal consent to proceed.  History of Present Illness Melanie Nicholson is a 48 year old female who presents for medication refill and evaluation of weight management options.  She has hypothyroidism with a TSH level over 150 in July due to a lapse in levothyroxine . She resumed her medication and is currently on 200 mcg daily, though she recalls a previous dose of 300 mcg.  Her cholesterol levels are newly elevated. She acknowledges a diet high in red meats and processed foods and is attempting to improve her eating habits. Recent lab work showed a normal A1c of 5.3 and a slightly low vitamin D  level, for which she takes prescription-strength supplements weekly.  She has a history of weight management challenges, including previous gastric bypass surgery. She has tried calorie restriction, meal replacements, over-the-counter weight loss medications, and phentermine without success. She aims to lose 20-40 pounds to reach a weight of 160 pounds.  She notes a history of acid reflux, which has recently recurred with severe symptoms such as choking and difficulty breathing at night.   MDD/ADHD/Bipolar Disorder/Schizoaffective: -Following with Psychiatry  -Mood status: stable -Current treatment: Seroquel XR 200 mg, Vyvanse 60 mg in the am and 20 mg in the afternoon, Latuda 120 mg, Xanax 1 mg -Satisfied with current treatment?: yes -Duration of current treatment : chronic -Side effects: no Medication compliance: excellent compliance     10/23/2023    1:00 PM  Depression  screen PHQ 2/9  Decreased Interest 1  Down, Depressed, Hopeless 1  PHQ - 2 Score 2  Altered sleeping 1  Tired, decreased energy 1  Change in appetite 1  Feeling bad or failure about yourself  0  Trouble concentrating 1  Moving slowly or fidgety/restless 0  Suicidal thoughts 0  PHQ-9 Score 6  Difficult doing work/chores Very difficult   Hypothyroidism: -Medications: Levothyroxine  200 mcg  -Patient is compliant with the above medication (s) at the above dose and reports no medication side effects.  -Denies weight changes, cold./heat intolerance, skin changes, anxiety/palpitations  -History of Graves disease s/p radioactive iodine i -Last TSH: >150 7/25 (had been out of medication at the time).  Hx of Gastric Bypass: -s/p in 2015, regained some weight, had been on a weight loss supplements (uncertain name) and did well but then discontinued and gained again  -Not currently on vitamins or supplements  -Drinking primer protein drinks as meal replacement, decreasing calories etc.   HLD: -Medications: Nothing -Last lipid panel: Lipid Panel     Component Value Date/Time   CHOL 250 (H) 01/08/2024 1322   TRIG 129 01/08/2024 1322   HDL 65 01/08/2024 1322   CHOLHDL 3.8 01/08/2024 1322   LDLCALC 160 (H) 01/08/2024 1322   The 10-year ASCVD risk score (Arnett DK, et al., 2019) is: 2.4%   Values used to calculate the score:     Age: 30 years     Clincally relevant sex: Female     Is Non-Hispanic African American: No     Diabetic: No  Tobacco smoker: Yes     Systolic Blood Pressure: 112 mmHg     Is BP treated: No     HDL Cholesterol: 65 mg/dL     Total Cholesterol: 250 mg/dL   Fibrosis 4 Score = 8.12 (Indeterminate)        Interpretation for patients with HCV          <1.45       -  F0-F1 (Low risk)          1.45-3.25 -  Indeterminate           >3.25      -  F3-F4 (High risk)     Validated for ages 48-65   Health Maintenance: -Blood work UTD -Mammogram due - will call to  schedule -Cologuard negative 7/25 -Pap due    Outpatient Encounter Medications as of 04/07/2024  Medication Sig   acetaminophen  (TYLENOL  8 HOUR) 650 MG CR tablet Take 1 tablet (650 mg total) by mouth every 8 (eight) hours as needed for pain.   clonazePAM (KLONOPIN) 1 MG tablet Take 1 mg by mouth 2 (two) times daily as needed.   DULoxetine (CYMBALTA) 30 MG capsule Take 30 mg by mouth daily.   ferrous sulfate 325 (65 FE) MG tablet Take 325 mg by mouth 2 (two) times a week.   levothyroxine  (SYNTHROID ) 200 MCG tablet Take 1 tablet (200 mcg total) by mouth daily.   lisdexamfetamine (VYVANSE) 60 MG capsule Take 60 mg by mouth every morning.    Lurasidone HCl 120 MG TABS Take 120 mg by mouth daily.   Multiple Vitamins-Minerals (BARIATRIC MULTIVITAMINS/IRON) CAPS Take 1 capsule by mouth 2 (two) times a week.    QUEtiapine (SEROQUEL XR) 200 MG 24 hr tablet Take 200 mg by mouth at bedtime.   VYVANSE 20 MG capsule Take 20 mg by mouth daily.   ALPRAZolam (XANAX) 1 MG tablet Take by mouth. (Patient not taking: Reported on 04/07/2024)   cyclobenzaprine  (FLEXERIL ) 5 MG tablet Take 1 tablet (5 mg total) by mouth 3 (three) times daily as needed. (Patient not taking: Reported on 04/07/2024)   Vitamin D , Ergocalciferol , (DRISDOL ) 1.25 MG (50000 UNIT) CAPS capsule Take 1 capsule (50,000 Units total) by mouth every 7 (seven) days. (Patient not taking: Reported on 04/07/2024)   No facility-administered encounter medications on file as of 04/07/2024.    Past Medical History:  Diagnosis Date   ADD (attention deficit disorder)    Anxiety    Asthma    Bipolar affective (HCC)    COPD (chronic obstructive pulmonary disease) (HCC)    Depression    Gastric bypass status for obesity    Hypothyroidism    Insomnia    Iron deficiency anemia due to chronic blood loss 09/11/2018   Pre-diabetes    Reflux    Sleep apnea    Thyroid  disease     Past Surgical History:  Procedure Laterality Date   CESAREAN SECTION      x4   CHOLECYSTECTOMY     DILATION AND CURETTAGE OF UTERUS     ECTOPIC PREGNANCY SURGERY     GALLBLADDER SURGERY     gastic bypass     05/2014 gastric   HYSTEROSCOPY WITH D & C N/A 02/28/2019   Procedure: DILATATION & CURETTAGE/HYSTEROSCOPY WITH MINERVA;  Surgeon: Connell Davies, MD;  Location: [facility] ORS;  Service: Gynecology;  Laterality: N/A;   TONSILLECTOMY      Family History  Problem Relation Age of Onset   Mental illness Mother  Heart disease Father    Thyroid  disease Father    Ovarian cancer Maternal Aunt    Ovarian cancer Maternal Aunt    Ovarian cancer Maternal Aunt    Diabetes Paternal Grandmother    Ovarian cancer Cousin    Thyroid  cancer Cousin    Breast cancer Neg Hx    Colon cancer Neg Hx     Social History   Socioeconomic History   Marital status: Legally Separated    Spouse name: Not on file   Number of children: Not on file   Years of education: Not on file   Highest education level: GED or equivalent  Occupational History   Not on file  Tobacco Use   Smoking status: Every Day    Types: Cigarettes   Smokeless tobacco: Never   Tobacco comments:    8 cigarretes a day  Vaping Use   Vaping status: Every Day  Substance and Sexual Activity   Alcohol use: Not Currently    Comment: occas   Drug use: Not Currently    Types: Marijuana    Comment: every now and then   Sexual activity: Yes    Birth control/protection: None    Comment: tubal  Other Topics Concern   Not on file  Social History Narrative   ** Merged History Encounter **       Social Drivers of Health   Financial Resource Strain: Low Risk  (10/17/2023)   Overall Financial Resource Strain (CARDIA)    Difficulty of Paying Living Expenses: Not very hard  Food Insecurity: Food Insecurity Present (10/17/2023)   Hunger Vital Sign    Worried About Running Out of Food in the Last Year: Sometimes true    Ran Out of Food in the Last Year: Sometimes true  Transportation Needs: No Transportation  Needs (10/17/2023)   PRAPARE - Administrator, Civil Service (Medical): No    Lack of Transportation (Non-Medical): No  Physical Activity: Insufficiently Active (10/17/2023)   Exercise Vital Sign    Days of Exercise per Week: 1 day    Minutes of Exercise per Session: 10 min  Stress: Stress Concern Present (10/17/2023)   [employer] of Occupational Health - Occupational Stress Questionnaire    Feeling of Stress : Very much  Social Connections: Moderately Integrated (10/17/2023)   Social Connection and Isolation Panel    Frequency of Communication with Friends and Family: More than three times a week    Frequency of Social Gatherings with Friends and Family: More than three times a week    Attends Religious Services: More than 4 times per year    Active Member of [employer] or Organizations: Yes    Attends Banker Meetings: More than 4 times per year    Marital Status: Separated  Intimate Partner Violence: Unknown (10/13/2021)   Received from [facility]   HITS    Physically Hurt: Not on file    Insult or Talk Down To: Not on file    Threaten Physical Harm: Not on file    Scream or Curse: Not on file    Review of Systems  All other systems reviewed and are negative.       Objective    BP 112/84 (Cuff Size: Large)   Pulse 99   Temp 98.2 F (36.8 C) (Oral)   Resp 16   Ht 5' 2 (1.575 m)   Wt 181 lb 1.6 oz (82.1 kg)   SpO2 99%   BMI 33.12  kg/m   Physical Exam Constitutional:      Appearance: Normal appearance. She is obese.  HENT:     Head: Normocephalic and atraumatic.  Eyes:     Conjunctiva/sclera: Conjunctivae normal.  Cardiovascular:     Rate and Rhythm: Normal rate and regular rhythm.  Pulmonary:     Effort: Pulmonary effort is normal.     Breath sounds: Normal breath sounds.  Skin:    General: Skin is warm and dry.  Neurological:     General: No focal deficit present.     Mental Status: She is alert. Mental status is at baseline.   Psychiatric:        Mood and Affect: Mood normal.        Behavior: Behavior normal.         Assessment & Plan:   Assessment & Plan Hypothyroidism Severe hypothyroidism likely due to non-adherence. Resumed levothyroxine , current dose 200 mcg. Re-evaluation needed for appropriate dosing. - Order TSH level to reassess thyroid  function. - Adjust levothyroxine  dose based on lab results.  Obesity BMI 33. Limited success with previous weight loss methods. Interested in [place]. Discussed insurance approval, long-term use, lifestyle changes, and side effects. Goal weight 160 lbs. - Submit prior authorization for [employer]. - Instruct her to call [facility] for [employer] availability. - Schedule follow-up in 3-4 weeks after starting [facility]. - Prescribe Prilosec for acid reflux as needed. - Recommend fiber supplement to prevent constipation.  Gastroesophageal reflux disease (GERD) Recurrent GERD with severe acid reflux, possibly related to diet or weight changes. - Prescribe Prilosec for acid reflux as needed.  Hypercholesterolemia Newly identified hypercholesterolemia with low 10-year cardiovascular risk. No immediate medication needed. - Reassess cholesterol levels next summer. - Advise dietary modifications to reduce cholesterol intake.  Vitamin D  deficiency Previously treated deficiency, transitioning to maintenance dose. - Recommend over-the-counter vitamin D  supplement, 1000-2000 IU daily.  General Health Maintenance Discussed routine health maintenance including mammograms and flu vaccinations. - Encourage scheduling of mammogram. - Confirm flu vaccine administration.  - Thyroid  Panel With TSH - omeprazole (PRILOSEC) 20 MG capsule; Take 1 capsule (20 mg total) by mouth daily.  Dispense: 90 capsule; Refill: 1 - semaglutide-weight management (WEGOVY) 0.25 MG/0.5ML SOAJ SQ injection; Inject 0.25 mg into the skin once a week.  Dispense: 2 mL; Refill: 0 - Flu vaccine  trivalent PF, 6mos and older(Flulaval,Afluria,Fluarix,Fluzone)   Return for will call to schedule.   Sharyle Fischer, DO

## 2024-04-08 ENCOUNTER — Other Ambulatory Visit (HOSPITAL_COMMUNITY): Payer: Self-pay

## 2024-04-08 ENCOUNTER — Ambulatory Visit: Payer: Self-pay | Admitting: Internal Medicine

## 2024-04-08 DIAGNOSIS — E039 Hypothyroidism, unspecified: Secondary | ICD-10-CM

## 2024-04-08 LAB — THYROID PANEL WITH TSH
Free Thyroxine Index: 3.4 (ref 1.4–3.8)
T3 Uptake: 29 % (ref 22–35)
T4, Total: 11.6 ug/dL (ref 5.1–11.9)
TSH: 0.1 m[IU]/L — ABNORMAL LOW

## 2024-04-08 MED ORDER — LEVOTHYROXINE SODIUM 175 MCG PO TABS
ORAL_TABLET | ORAL | 1 refills | Status: AC
Start: 2024-04-08 — End: ?

## 2024-04-08 MED ORDER — LEVOTHYROXINE SODIUM 200 MCG PO TABS
ORAL_TABLET | ORAL | 1 refills | Status: AC
Start: 2024-04-08 — End: ?

## 2024-04-09 ENCOUNTER — Other Ambulatory Visit: Payer: Self-pay | Admitting: Internal Medicine

## 2024-04-09 DIAGNOSIS — E66811 Obesity, class 1: Secondary | ICD-10-CM

## 2024-04-09 MED ORDER — CONTRAVE 8-90 MG PO TB12
ORAL_TABLET | ORAL | 2 refills | Status: AC
Start: 2024-04-09 — End: ?

## 2024-04-10 ENCOUNTER — Other Ambulatory Visit (HOSPITAL_COMMUNITY): Payer: Self-pay

## 2024-04-14 ENCOUNTER — Other Ambulatory Visit (HOSPITAL_COMMUNITY): Payer: Self-pay

## 2024-06-11 ENCOUNTER — Other Ambulatory Visit (HOSPITAL_COMMUNITY): Payer: Self-pay

## 2024-07-29 ENCOUNTER — Other Ambulatory Visit: Payer: Self-pay

## 2024-07-29 ENCOUNTER — Emergency Department: Payer: MEDICAID

## 2024-07-29 DIAGNOSIS — Z5321 Procedure and treatment not carried out due to patient leaving prior to being seen by health care provider: Secondary | ICD-10-CM | POA: Diagnosis not present

## 2024-07-29 DIAGNOSIS — R079 Chest pain, unspecified: Secondary | ICD-10-CM | POA: Diagnosis present

## 2024-07-29 NOTE — ED Triage Notes (Signed)
 Pt reports she noticed her lips were blue earlier tonight and she was having chest pain. Pt reports inc fatigue. Denies known cardiac hx.

## 2024-07-30 ENCOUNTER — Telehealth: Payer: Self-pay

## 2024-07-30 ENCOUNTER — Emergency Department
Admission: EM | Admit: 2024-07-30 | Discharge: 2024-07-30 | Payer: MEDICAID | Attending: Emergency Medicine | Admitting: Emergency Medicine

## 2024-07-30 LAB — BASIC METABOLIC PANEL WITH GFR
Anion gap: 13 (ref 5–15)
BUN: 11 mg/dL (ref 6–20)
CO2: 24 mmol/L (ref 22–32)
Calcium: 9.4 mg/dL (ref 8.9–10.3)
Chloride: 102 mmol/L (ref 98–111)
Creatinine, Ser: 0.68 mg/dL (ref 0.44–1.00)
GFR, Estimated: >60 mL/min
Glucose, Bld: 111 mg/dL — ABNORMAL HIGH (ref 70–99)
Potassium: 4.1 mmol/L (ref 3.5–5.1)
Sodium: 139 mmol/L (ref 135–145)

## 2024-07-30 LAB — CBC
HCT: 38.5 % (ref 36.0–46.0)
Hemoglobin: 13.3 g/dL (ref 12.0–15.0)
MCH: 31.1 pg (ref 26.0–34.0)
MCHC: 34.5 g/dL (ref 30.0–36.0)
MCV: 90.2 fL (ref 80.0–100.0)
Platelets: 190 K/uL (ref 150–400)
RBC: 4.27 MIL/uL (ref 3.87–5.11)
RDW: 12.1 % (ref 11.5–15.5)
WBC: 5 K/uL (ref 4.0–10.5)
nRBC: 0 % (ref 0.0–0.2)

## 2024-07-30 LAB — TROPONIN T, HIGH SENSITIVITY: Troponin T High Sensitivity: <6 ng/L (ref 0–19)

## 2024-07-30 NOTE — Telephone Encounter (Signed)
 Copied from CRM #[id number]. Topic: Clinical - Lab/Test Results >> Jul 30, 2024  3:22 PM Winona R wrote: Pt went to ER last night and had labs done but left before she can speak with a Dr. Almeta would like to know if a nurse can give her a call to explain her results.

## 2024-07-31 ENCOUNTER — Encounter: Payer: Self-pay | Admitting: Family Medicine

## 2024-07-31 ENCOUNTER — Ambulatory Visit (INDEPENDENT_AMBULATORY_CARE_PROVIDER_SITE_OTHER): Payer: MEDICAID | Admitting: Family Medicine

## 2024-07-31 VITALS — BP 122/86 | HR 97 | Resp 16 | Ht 62.0 in | Wt 190.0 lb

## 2024-07-31 DIAGNOSIS — E039 Hypothyroidism, unspecified: Secondary | ICD-10-CM

## 2024-07-31 DIAGNOSIS — E559 Vitamin D deficiency, unspecified: Secondary | ICD-10-CM | POA: Diagnosis not present

## 2024-07-31 DIAGNOSIS — G4733 Obstructive sleep apnea (adult) (pediatric): Secondary | ICD-10-CM

## 2024-07-31 DIAGNOSIS — E785 Hyperlipidemia, unspecified: Secondary | ICD-10-CM | POA: Insufficient documentation

## 2024-07-31 DIAGNOSIS — R079 Chest pain, unspecified: Secondary | ICD-10-CM | POA: Diagnosis not present

## 2024-07-31 DIAGNOSIS — E782 Mixed hyperlipidemia: Secondary | ICD-10-CM | POA: Diagnosis not present

## 2024-07-31 DIAGNOSIS — R5383 Other fatigue: Secondary | ICD-10-CM | POA: Diagnosis not present

## 2024-07-31 DIAGNOSIS — R739 Hyperglycemia, unspecified: Secondary | ICD-10-CM | POA: Diagnosis not present

## 2024-07-31 NOTE — Assessment & Plan Note (Signed)
 Hyperlipidemia uncontrolled, last LDL 160 in 01/2024. Not fasting today.   -Discussed dietary changes, written education provided with AVS  -F/u with PCP in 2 weeks

## 2024-07-31 NOTE — Progress Notes (Signed)
 "  Acute Office Visit  Subjective:     Patient ID: Melanie Nicholson, female    DOB: 1976/07/03, 49 y.o.   MRN: [id number]  Chief Complaint  Patient presents with   Follow-up    Went to ER for chest pain and had labs done. Would like to review results since leaving before speaking with a Dr.   Chest Pain    X2 weeks, comes/goes. None today. Noticed lips were turning blue that comes/goes.   Fatigue    X1 week    Chest Pain  Associated symptoms include headaches and malaise/fatigue. Pertinent negatives include no dizziness, palpitations or shortness of breath.   Patient is in today for ER follow up. Presented to the ER the night of 07/29/24, but left yesterday (07/30/24) early morning prior to being evaluated by a provider. She voices she has been having chest pain on and off for the last two weeks prompting her ER visit. She voices she felt that her lips were turning blue as well, describing this to be intermittent. As noted, she admits she left the ER after labs were completed before she could be seen by a provider. She is here today for ER follow up and wishes to review results that were completed while at the ER. Labs completed includes CBC which is unremarkable, BMP also unremarkable only noting mild hyperglycemia, and initial troponin which was negative.    She also voices complaints of fatigue. She feels that she has been more tired than usual over the last one week. Labs completed in 03/2024 where TSH was too low, previously 150 in 01/2024. At time of last visit her Levothyroxine  was decreased to 175mcg with instructions to alternate 175mcg and 200mcg and return in 6 to 8 weeks for repeat labs but she failed to do this. No recent TSH. Will update labs at time of visit.   She also voices that when sleeping she will have numbness and tingling that initially was affecting her hands, progressed to upper extremities and now affecting her lower extremities stopping at her knees. She describes  numbness and tingling but also describes this as a painful sensation. She voices this started in the last couple of months and worsened over the last couple of months, further voicing the legs became affected in the last couple of weeks. She denies swelling of lower extremities. Denies associated shortness of breath. She is not sure if she snores but does note that she has been told that she snores. She voices she had been diagnosed with OSA in the past but after weight loss OSA was controlled. Last sleep study was years ago.  Referring back to her concerns that her lips turn blue she voices she has noticed this during the day but her son and daughter have told her that lips look blue at night.    Will note patient is a smoker, both cigarettes and vapes. Right upper lobe wheezing at time of auscultation. Encouraged tobacco cessation especially in the setting of chest pain.    Review of Systems  Constitutional:  Positive for malaise/fatigue.  Respiratory:  Negative for shortness of breath.   Cardiovascular:  Positive for chest pain. Negative for palpitations and leg swelling.  Neurological:  Positive for headaches. Negative for dizziness.        Objective:    BP 122/86   Pulse 97   Resp 16   Ht 5' 2 (1.575 m)   Wt 190 lb (86.2 kg)   SpO2 99%  BMI 34.75 kg/m  BP Readings from Last 3 Encounters:  07/31/24 122/86  07/29/24 (!) 148/104  04/07/24 112/84   Wt Readings from Last 3 Encounters:  07/31/24 190 lb (86.2 kg)  07/29/24 190 lb (86.2 kg)  04/07/24 181 lb 1.6 oz (82.1 kg)   SpO2 Readings from Last 3 Encounters:  07/31/24 99%  07/29/24 98%  04/07/24 99%      Last CBC Lab Results  Component Value Date   WBC 5.0 07/29/2024   HGB 13.3 07/29/2024   HCT 38.5 07/29/2024   MCV 90.2 07/29/2024   MCH 31.1 07/29/2024   RDW 12.1 07/29/2024   PLT 190 07/29/2024   Last metabolic panel Lab Results  Component Value Date   GLUCOSE 111 (H) 07/29/2024   NA 139 07/29/2024   K  4.1 07/29/2024   CL 102 07/29/2024   CO2 24 07/29/2024   BUN 11 07/29/2024   CREATININE 0.68 07/29/2024   GFRNONAA >60 07/29/2024   CALCIUM 9.4 07/29/2024   PROT 6.8 01/08/2024   ALBUMIN 3.7 10/13/2022   BILITOT 0.5 01/08/2024   ALKPHOS 31 (L) 10/13/2022   AST 51 (H) 01/08/2024   ALT 50 (H) 01/08/2024   ANIONGAP 13 07/29/2024   Last lipids Lab Results  Component Value Date   CHOL 250 (H) 01/08/2024   HDL 65 01/08/2024   LDLCALC 160 (H) 01/08/2024   TRIG 129 01/08/2024   CHOLHDL 3.8 01/08/2024   Last hemoglobin A1c Lab Results  Component Value Date   HGBA1C 5.3 01/08/2024   Last thyroid  functions Lab Results  Component Value Date   TSH 0.10 (L) 04/07/2024   T4TOTAL 11.6 04/07/2024   FREET4 0.60 (L) 10/13/2022   Last vitamin D  Lab Results  Component Value Date   VD25OH 26 (L) 01/08/2024   Last vitamin B12 and Folate Lab Results  Component Value Date   VITAMINB12 598 01/08/2024   FOLATE 8.2 09/11/2018      Physical Exam Constitutional:      General: She is not in acute distress.    Appearance: Normal appearance. She is well-developed.  Neck:     Vascular: No carotid bruit.  Cardiovascular:     Rate and Rhythm: Normal rate and regular rhythm.     Heart sounds: Normal heart sounds.  Pulmonary:     Effort: Pulmonary effort is normal. No respiratory distress.     Breath sounds: Examination of the right-upper field reveals wheezing. Wheezing present.     Comments: Left upper lobe clear to auscultation. Bilateral lower lobes clear to auscultation.  Neurological:     General: No focal deficit present.     Mental Status: She is alert.  Psychiatric:        Mood and Affect: Mood normal.        Behavior: Behavior normal.        Assessment & Plan:   Assessment & Plan Chest pain, unspecified type Complaints of chest pain described as intermittent for the last two weeks. Left prior to provider evaluation when presenting to the ER on 07/29/24. EKG obtained  stable,  sinus tachycardia. Troponin negative. Labs unremarkable, see HPI for details.  -Referral to cardiology -Follow up with PCP in 2 weeks -Red flag symptoms/ ED precautions advised includes chest pain for more than 20 minutes, chest pain with associated numbness/tingling, radiation of pain, weakness, headache, or shortness of breath.  Orders:   Comprehensive Metabolic Panel (CMET)   Ambulatory referral to Cardiology  Hypothyroidism, unspecified type Hypothyroidism uncontrolled, last  TSH 0.10 (03/2024) and prior to this 150 (01/2024).   -Update labs -Continue current medication regimen at this time, potential changes pending lab results  -Follow up with PCP in 2 weeks  Orders:   TSH + free T4  Vitamin D  deficiency Vitamin D  deficiency uncontrolled, last vitamin D  level 26 in 01/2024. Previously taking weekly vitamin D  prescription but has been out of this for several months, and she reports she did not start an OTC vitamin D .   -Update labs  Orders:   Vitamin D  (25 hydroxy)  Other fatigue Complaints of fatigue worsening in the last one week. See HPI for details Recent CBC unremarkable without findings of anemia. BMP overall stable, mild hyperglycemia.  Suspect that uncontrolled vitamin D  deficiency and uncontrolled hypothyroidism are contributing to fatigue  -Repeat CMP -Add on B12 & folate level with today's labs to rule out B12 or folic acid  deficiency contributing to fatigue -Update thyroid  profile -Update vitamin D  level  -Follow up with PCP in 2 weeks   Orders:   HgB A1c   TSH + free T4   B12 and Folate Panel  Mixed hyperlipidemia Hyperlipidemia uncontrolled, last LDL 160 in 01/2024. Not fasting today.   -Discussed dietary changes, written education provided with AVS  -F/u with PCP in 2 weeks     OSA (obstructive sleep apnea) OSA diagnosed in the past, last sleep study several years ago.   -Referral to pulmonology  Orders:   Ambulatory referral to  Pulmonology  Hyperglycemia Mild hyperglycemia seen on BMP from 07/29/24. Screen for prediabetes/ diabetes. Noted hyperlipidemia, discussed at last visit with PCP in 03/2024.  -A1c ordered -F/u with PCP in 2 weeks  Orders:   HgB A1c     Return in about 2 weeks (around 08/14/2024) for follow up with PCP, Dr. Bernardo .  LAYMON LOISE CORE, FNP  "

## 2024-08-01 ENCOUNTER — Ambulatory Visit: Payer: Self-pay | Admitting: Family Medicine

## 2024-08-01 LAB — HEMOGLOBIN A1C
Hgb A1c MFr Bld: 5.2 %
Mean Plasma Glucose: 103 mg/dL
eAG (mmol/L): 5.7 mmol/L

## 2024-08-01 LAB — COMPREHENSIVE METABOLIC PANEL WITH GFR
AG Ratio: 1.9 (calc) (ref 1.0–2.5)
ALT: 28 U/L (ref 6–29)
AST: 23 U/L (ref 10–35)
Albumin: 4.2 g/dL (ref 3.6–5.1)
Alkaline phosphatase (APISO): 34 U/L (ref 31–125)
BUN: 15 mg/dL (ref 7–25)
CO2: 28 mmol/L (ref 20–32)
Calcium: 9.3 mg/dL (ref 8.6–10.2)
Chloride: 105 mmol/L (ref 98–110)
Creat: 0.91 mg/dL (ref 0.50–0.99)
Globulin: 2.2 g/dL (ref 1.9–3.7)
Glucose, Bld: 81 mg/dL (ref 65–99)
Potassium: 4.8 mmol/L (ref 3.5–5.3)
Sodium: 139 mmol/L (ref 135–146)
Total Bilirubin: 0.4 mg/dL (ref 0.2–1.2)
Total Protein: 6.4 g/dL (ref 6.1–8.1)
eGFR: 79 mL/min/1.73m2

## 2024-08-01 LAB — B12 AND FOLATE PANEL
Folate: 9.7 ng/mL
Vitamin B-12: 363 pg/mL (ref 200–1100)

## 2024-08-01 LAB — TSH+FREE T4: TSH W/REFLEX TO FT4: 0.67 m[IU]/L

## 2024-08-01 LAB — VITAMIN D 25 HYDROXY (VIT D DEFICIENCY, FRACTURES): Vit D, 25-Hydroxy: 24 ng/mL — ABNORMAL LOW (ref 30–100)

## 2024-08-05 ENCOUNTER — Ambulatory Visit: Payer: MEDICAID

## 2024-08-05 VITALS — BP 118/82 | HR 86 | Ht 62.0 in | Wt 196.0 lb

## 2024-08-05 DIAGNOSIS — E782 Mixed hyperlipidemia: Secondary | ICD-10-CM | POA: Diagnosis not present

## 2024-08-05 DIAGNOSIS — Z8249 Family history of ischemic heart disease and other diseases of the circulatory system: Secondary | ICD-10-CM | POA: Insufficient documentation

## 2024-08-05 DIAGNOSIS — R072 Precordial pain: Secondary | ICD-10-CM | POA: Insufficient documentation

## 2024-08-05 DIAGNOSIS — R079 Chest pain, unspecified: Secondary | ICD-10-CM | POA: Insufficient documentation

## 2024-08-05 MED ORDER — METOPROLOL TARTRATE 100 MG PO TABS: ORAL_TABLET | ORAL | 0 refills | Status: AC

## 2024-08-05 MED ORDER — ROSUVASTATIN CALCIUM 5 MG PO TABS: 5.0000 mg | ORAL_TABLET | Freq: Every day | ORAL | 3 refills | Status: AC

## 2024-08-05 NOTE — Progress Notes (Signed)
" °  Cardiology Office Note   Date:  08/05/2024  ID:  Emiliana, Blaize 15-Dec-1975, MRN [id number] PCP: Bernardo Fend, DO  [facility] Providers Cardiologist:  Caron Poser, MD     History of Present Illness Melanie Nicholson is a 49 y.o. female PMH HLD, hypothyroidism, obesity who presents for chest discomfort.  Seen by PCP for this issue 07/31/2024.  Had been in the ER the day prior but left before evaluation was complete.  TSH normal.  CMP normal.  Troponin negative.  Last LDL 160 01/2024.  Patient notes chest discomfort has been going on for about 2 weeks so far.  Sometimes at rest, sometimes with exertion.  Also reports numbness of the bilateral arms and legs that seems to only occur at night.  No syncope.  No palpitations.  Father had an MI requiring a stent.  Relevant CVD History -None   ROS: Pt denies any palpitations, syncope, presyncope, orthopnea, PND, or LE edema.  Studies Reviewed I have independently reviewed the patient's ECG, previous medical records, previous blood work.  Physical Exam VS:  BP 118/82 (BP Location: Left Arm, Patient Position: Sitting, Cuff Size: Normal)   Pulse 86   Ht 5' 2 (1.575 m)   Wt 196 lb (88.9 kg)   SpO2 98%   BMI 35.85 kg/m        Wt Readings from Last 3 Encounters:  08/05/24 196 lb (88.9 kg)  07/31/24 190 lb (86.2 kg)  07/29/24 190 lb (86.2 kg)    GEN: No acute distress. NECK: No JVD; No carotid bruits. CARDIAC: RRR, no murmurs, rubs, gallops. RESPIRATORY:  Clear to auscultation. EXTREMITIES:  Warm and well-perfused. No edema.  ASSESSMENT AND PLAN Chest discomfort Family history of CAD Patient presents with undifferentiated chest discomfort that was bad enough to bring her to the ED.  She ruled out for MI at that time.  She does have risk factors including uncontrolled hyperlipidemia and family history of CAD in her father requiring a stent.  Further stratification is indicated.  Plan: - Coronary CT angiogram to  evaluate for obstructive CAD - Echocardiogram to evaluate structural causes  HLD Uncontrolled.  Last LDL 160 03/2024.  LDL goal less than 100.  If her CT shows any plaque or stenoses, then LDL goal would be less than 70.  Plan: - Start Crestor  5 mg daily        Dispo: RTC 3 months or sooner as needed  Signed, Caron Poser, MD  "

## 2024-08-05 NOTE — Patient Instructions (Signed)
 Medication Instructions:  - START crestor  5 mg daily  - take one 100 mg metoprolol  TWO HOURS PRIOR TO CARDIAC CTA  *If you need a refill on your cardiac medications before your next appointment, please call your pharmacy*  Lab Work: No labs ordered today  If you have labs (blood work) drawn today and your tests are completely normal, you will receive your results only by: MyChart Message (if you have MyChart) OR A paper copy in the mail If you have any lab test that is abnormal or we need to change your treatment, we will call you to review the results.  Testing/Procedures: Your physician has requested that you have an echocardiogram. Echocardiography is a painless test that uses sound waves to create images of your heart. It provides your doctor with information about the size and shape of your heart and how well your hearts chambers and valves are working.   You may receive an ultrasound enhancing agent through an IV if needed to better visualize your heart during the echo. This procedure takes approximately one hour.  There are no restrictions for this procedure.  This will take place at [address] [facility], [place] [address]  Please note: We ask at that you not bring children with you during ultrasound (echo/ vascular) testing. Due to room size and safety concerns, children are not allowed in the ultrasound rooms during exams. Our front office staff cannot provide observation of children in our lobby area while testing is being conducted. An adult accompanying a patient to their appointment will only be allowed in the ultrasound room at the discretion of the ultrasound technician under special circumstances. We apologize for any inconvenience.   Your cardiac CT will be scheduled at one of the below locations:    [facility] [address] [place], [place] [address] [phone]  OR   [facility] [address]  [place], [place] [address]  If scheduled at the [facility] at [address], please enter the parking lot using the [address] entrance and use the FREE valet service at the patient drop-off area. Enter the building and check-in with registration on the main floor.  If scheduled at [facility], please arrive to the Heart and Vascular Center 15 mins early for check-in and test prep.  There is spacious parking and easy access to the radiology department from the [facility] entrance. Please enter here and check-in with the desk attendant.   Please follow these instructions carefully (unless otherwise directed):  An IV will be required for this test and Nitroglycerin will be given.  Hold all erectile dysfunction medications at least 3 days (72 hrs) prior to test. (Ie viagra, cialis, sildenafil, tadalafil, etc)   On the Night Before the Test: Be sure to Drink plenty of water. Do not consume any caffeinated/decaffeinated beverages or chocolate 12 hours prior to your test. Do not take any antihistamines 12 hours prior to your test.   On the Day of the Test: Drink plenty of water until 1 hour prior to the test. Do not eat any food 1 hour prior to test. You may take your regular medications prior to the test.  Take metoprolol  (Lopressor ) two hours prior to test. Patients who wear a continuous glucose monitor MUST remove the device prior to scanning. FEMALES- please wear underwire-free bra if available, avoid dresses & tight clothing  After the Test: Drink plenty of water. After receiving IV contrast, you may experience a mild flushed feeling. This is normal. On occasion, you may experience a mild rash up to 24 hours after the test. This is not dangerous. If this occurs, you can take Benadryl  25 mg, Zyrtec, Claritin, or Allegra and increase your fluid intake. (Patients taking Tikosyn should avoid Benadryl , and may  take Zyrtec, Claritin, or Allegra) If you experience trouble breathing, this can be serious. If it is severe call 911 IMMEDIATELY. If it is mild, please call our office.  We will call to schedule your test 2-4 weeks out understanding that some insurance companies will need an authorization prior to the service being performed.   For more information and frequently asked questions, please visit our website : [email or web address]  For non-scheduling related questions, please contact the cardiac imaging nurse navigator should you have any questions/concerns: Cardiac Imaging Nurse Navigators Direct Office Dial: [phone]   For scheduling needs, including cancellations and rescheduling, please call Brittany, [phone].   Follow-Up: At [facility], you and your health needs are our priority.  As part of our continuing mission to provide you with exceptional heart care, our providers are all part of one team.  This team includes your primary Cardiologist (physician) and Advanced Practice Providers or APPs (Physician Assistants and Nurse Practitioners) who all work together to provide you with the care you need, when you need it.  Your next appointment:   3 month(s)  Provider:   You may see Caron Poser, MD or one of the following Advanced Practice Providers on your designated Care Team:   Lonni Meager, NP Lesley Maffucci, PA-C Bernardino Bring, PA-C Cadence Stuart, PA-C Tylene Lunch, NP Barnie Hila, NP    We recommend signing up for the patient portal called MyChart.  Sign up information is provided on this After Visit Summary.  MyChart is used to connect with patients for Virtual Visits (Telemedicine).  Patients are able to view lab/test results, encounter notes, upcoming appointments, etc.  Non-urgent messages can be sent to your provider as well.   To learn more about what you can do with MyChart, go to [email or web address].

## 2024-08-07 ENCOUNTER — Ambulatory Visit: Payer: MEDICAID

## 2024-08-15 ENCOUNTER — Ambulatory Visit: Payer: MEDICAID

## 2024-08-15 ENCOUNTER — Ambulatory Visit: Payer: Self-pay

## 2024-08-15 DIAGNOSIS — R079 Chest pain, unspecified: Secondary | ICD-10-CM

## 2024-08-15 LAB — ECHOCARDIOGRAM COMPLETE
AR max vel: 2.86 cm2
AV Area VTI: 2.85 cm2
AV Area mean vel: 2.71 cm2
AV Mean grad: 2 mmHg
AV Peak grad: 4.2 mmHg
Ao pk vel: 1.03 m/s
Area-P 1/2: 3.6 cm2
S' Lateral: 2.5 cm

## 2024-08-21 ENCOUNTER — Ambulatory Visit: Payer: MEDICAID

## 2024-09-11 ENCOUNTER — Ambulatory Visit: Payer: MEDICAID | Admitting: Internal Medicine

## 2024-11-11 ENCOUNTER — Ambulatory Visit: Payer: MEDICAID
